# Patient Record
Sex: Male | Born: 1950 | Race: White | Hispanic: No | Marital: Married | Smoking: Never smoker
Health system: Southern US, Community
[De-identification: ages and names within clinical notes are randomized; demographics above are authoritative.]

## PROBLEM LIST (undated history)

## (undated) DIAGNOSIS — G40909 Epilepsy, unspecified, not intractable, without status epilepticus: Secondary | ICD-10-CM

## (undated) DIAGNOSIS — I951 Orthostatic hypotension: Secondary | ICD-10-CM

## (undated) DIAGNOSIS — F329 Major depressive disorder, single episode, unspecified: Secondary | ICD-10-CM

## (undated) DIAGNOSIS — R931 Abnormal findings on diagnostic imaging of heart and coronary circulation: Secondary | ICD-10-CM

## (undated) DIAGNOSIS — N400 Enlarged prostate without lower urinary tract symptoms: Secondary | ICD-10-CM

## (undated) DIAGNOSIS — R569 Unspecified convulsions: Secondary | ICD-10-CM

## (undated) DIAGNOSIS — G709 Myoneural disorder, unspecified: Secondary | ICD-10-CM

## (undated) DIAGNOSIS — F419 Anxiety disorder, unspecified: Secondary | ICD-10-CM

## (undated) DIAGNOSIS — Z9889 Other specified postprocedural states: Secondary | ICD-10-CM

## (undated) DIAGNOSIS — Z8719 Personal history of other diseases of the digestive system: Secondary | ICD-10-CM

## (undated) DIAGNOSIS — R6889 Other general symptoms and signs: Secondary | ICD-10-CM

## (undated) DIAGNOSIS — F32A Depression, unspecified: Secondary | ICD-10-CM

## (undated) DIAGNOSIS — G903 Multi-system degeneration of the autonomic nervous system: Secondary | ICD-10-CM

## (undated) DIAGNOSIS — G4739 Other sleep apnea: Secondary | ICD-10-CM

## (undated) HISTORY — DX: Epilepsy, unspecified, not intractable, without status epilepticus: G40.909

## (undated) HISTORY — DX: Benign prostatic hyperplasia without lower urinary tract symptoms: N40.0

## (undated) HISTORY — DX: Personal history of other diseases of the digestive system: Z87.19

## (undated) HISTORY — DX: Abnormal findings on diagnostic imaging of heart and coronary circulation: R93.1

## (undated) HISTORY — DX: Major depressive disorder, single episode, unspecified: F32.9

## (undated) HISTORY — DX: Depression, unspecified: F32.A

## (undated) HISTORY — DX: Orthostatic hypotension: I95.1

## (undated) HISTORY — DX: Other specified postprocedural states: Z98.890

## (undated) HISTORY — DX: Multi-system degeneration of the autonomic nervous system: G90.3

## (undated) HISTORY — DX: Other sleep apnea: G47.39

---

## 2008-01-08 HISTORY — PX: HERNIA REPAIR: SHX51

## 2008-02-09 ENCOUNTER — Encounter: Admission: RE | Admit: 2008-02-09 | Discharge: 2008-02-09 | Payer: Self-pay | Admitting: General Surgery

## 2008-02-11 ENCOUNTER — Ambulatory Visit (HOSPITAL_BASED_OUTPATIENT_CLINIC_OR_DEPARTMENT_OTHER): Admission: RE | Admit: 2008-02-11 | Discharge: 2008-02-11 | Payer: Self-pay | Admitting: General Surgery

## 2008-03-07 ENCOUNTER — Encounter: Admission: RE | Admit: 2008-03-07 | Discharge: 2008-03-07 | Payer: Self-pay | Admitting: General Surgery

## 2008-07-14 ENCOUNTER — Inpatient Hospital Stay (HOSPITAL_COMMUNITY): Admission: EM | Admit: 2008-07-14 | Discharge: 2008-07-16 | Payer: Self-pay | Admitting: Emergency Medicine

## 2008-07-15 ENCOUNTER — Encounter (INDEPENDENT_AMBULATORY_CARE_PROVIDER_SITE_OTHER): Payer: Self-pay | Admitting: Internal Medicine

## 2008-07-18 ENCOUNTER — Inpatient Hospital Stay (HOSPITAL_COMMUNITY): Admission: EM | Admit: 2008-07-18 | Discharge: 2008-07-29 | Payer: Self-pay | Admitting: Emergency Medicine

## 2008-07-19 ENCOUNTER — Encounter (INDEPENDENT_AMBULATORY_CARE_PROVIDER_SITE_OTHER): Payer: Self-pay | Admitting: Internal Medicine

## 2008-08-09 ENCOUNTER — Emergency Department (HOSPITAL_COMMUNITY): Admission: EM | Admit: 2008-08-09 | Discharge: 2008-08-09 | Payer: Self-pay | Admitting: Emergency Medicine

## 2008-10-20 ENCOUNTER — Encounter: Payer: Self-pay | Admitting: Cardiology

## 2008-10-27 ENCOUNTER — Encounter: Payer: Self-pay | Admitting: Cardiology

## 2008-11-02 ENCOUNTER — Inpatient Hospital Stay (HOSPITAL_COMMUNITY): Admission: EM | Admit: 2008-11-02 | Discharge: 2008-11-07 | Payer: Self-pay | Admitting: Emergency Medicine

## 2008-11-03 ENCOUNTER — Encounter (INDEPENDENT_AMBULATORY_CARE_PROVIDER_SITE_OTHER): Payer: Self-pay | Admitting: Internal Medicine

## 2008-11-07 ENCOUNTER — Inpatient Hospital Stay (HOSPITAL_COMMUNITY): Admission: AD | Admit: 2008-11-07 | Discharge: 2008-11-08 | Payer: Self-pay | Admitting: Psychiatry

## 2008-11-07 ENCOUNTER — Ambulatory Visit: Payer: Self-pay | Admitting: Psychiatry

## 2008-11-09 ENCOUNTER — Other Ambulatory Visit (HOSPITAL_COMMUNITY): Admission: RE | Admit: 2008-11-09 | Discharge: 2008-11-15 | Payer: Self-pay | Admitting: Psychiatry

## 2008-11-10 ENCOUNTER — Encounter: Payer: Self-pay | Admitting: Cardiology

## 2008-11-14 ENCOUNTER — Ambulatory Visit: Payer: Self-pay | Admitting: Endocrinology

## 2008-11-14 ENCOUNTER — Ambulatory Visit: Payer: Self-pay | Admitting: Cardiology

## 2008-11-14 DIAGNOSIS — R109 Unspecified abdominal pain: Secondary | ICD-10-CM | POA: Insufficient documentation

## 2008-11-14 DIAGNOSIS — R339 Retention of urine, unspecified: Secondary | ICD-10-CM

## 2008-11-14 DIAGNOSIS — I951 Orthostatic hypotension: Secondary | ICD-10-CM | POA: Insufficient documentation

## 2008-11-14 DIAGNOSIS — F329 Major depressive disorder, single episode, unspecified: Secondary | ICD-10-CM | POA: Insufficient documentation

## 2008-11-14 DIAGNOSIS — Q078 Other specified congenital malformations of nervous system: Secondary | ICD-10-CM

## 2008-11-14 DIAGNOSIS — H0520 Unspecified exophthalmos: Secondary | ICD-10-CM | POA: Insufficient documentation

## 2008-11-14 LAB — CONVERTED CEMR LAB
Bilirubin Urine: NEGATIVE
Leukocytes, UA: NEGATIVE
Nitrite: NEGATIVE
Urine Glucose: NEGATIVE mg/dL
pH: 5.5 (ref 5.0–8.0)

## 2008-11-15 ENCOUNTER — Telehealth (INDEPENDENT_AMBULATORY_CARE_PROVIDER_SITE_OTHER): Payer: Self-pay | Admitting: *Deleted

## 2008-11-15 ENCOUNTER — Telehealth: Payer: Self-pay | Admitting: Cardiology

## 2008-11-16 ENCOUNTER — Encounter: Payer: Self-pay | Admitting: Endocrinology

## 2008-11-16 ENCOUNTER — Encounter: Payer: Self-pay | Admitting: Internal Medicine

## 2008-11-16 ENCOUNTER — Encounter: Payer: Self-pay | Admitting: Cardiology

## 2008-11-17 ENCOUNTER — Telehealth: Payer: Self-pay | Admitting: Cardiology

## 2008-11-17 LAB — CONVERTED CEMR LAB
Alpha-2-Globulin: 9.6 % (ref 7.1–11.8)
Beta Globulin: 5.1 % (ref 4.7–7.2)
Gamma Globulin: 12.4 % (ref 11.1–18.8)

## 2008-11-22 ENCOUNTER — Telehealth (INDEPENDENT_AMBULATORY_CARE_PROVIDER_SITE_OTHER): Payer: Self-pay | Admitting: *Deleted

## 2008-11-23 ENCOUNTER — Ambulatory Visit (HOSPITAL_COMMUNITY): Admission: RE | Admit: 2008-11-23 | Discharge: 2008-11-23 | Payer: Self-pay | Admitting: Gastroenterology

## 2008-11-23 LAB — CONVERTED CEMR LAB
Albumin, U: DETECTED %
Free Lambda Lt Chains,Ur: 0.16 mg/dL (ref 0.08–1.01)
Total Protein, Urine-Ur/day: 76 mg/24hr (ref 10–140)
Total Protein, Urine: 7.6 mg/dL
Volume, Urine: 1000 mL

## 2008-11-24 ENCOUNTER — Telehealth: Payer: Self-pay | Admitting: Cardiology

## 2008-11-25 ENCOUNTER — Telehealth: Payer: Self-pay | Admitting: Cardiology

## 2008-11-25 ENCOUNTER — Ambulatory Visit (HOSPITAL_COMMUNITY): Admission: RE | Admit: 2008-11-25 | Discharge: 2008-11-25 | Payer: Self-pay | Admitting: Gastroenterology

## 2008-12-02 ENCOUNTER — Ambulatory Visit: Payer: Self-pay | Admitting: Cardiology

## 2008-12-02 DIAGNOSIS — R269 Unspecified abnormalities of gait and mobility: Secondary | ICD-10-CM

## 2008-12-06 ENCOUNTER — Telehealth (INDEPENDENT_AMBULATORY_CARE_PROVIDER_SITE_OTHER): Payer: Self-pay | Admitting: *Deleted

## 2008-12-14 ENCOUNTER — Encounter: Payer: Self-pay | Admitting: Cardiology

## 2008-12-30 ENCOUNTER — Telehealth: Payer: Self-pay | Admitting: Nurse Practitioner

## 2009-01-03 ENCOUNTER — Ambulatory Visit: Payer: Self-pay | Admitting: Cardiology

## 2009-01-16 ENCOUNTER — Telehealth: Payer: Self-pay | Admitting: Cardiology

## 2009-02-16 ENCOUNTER — Encounter: Payer: Self-pay | Admitting: Cardiology

## 2009-04-17 ENCOUNTER — Ambulatory Visit: Payer: Self-pay | Admitting: Cardiology

## 2009-05-24 ENCOUNTER — Encounter: Payer: Self-pay | Admitting: Cardiology

## 2009-06-22 IMAGING — CR DG CHEST SPECIAL VIEW
1 series · 1 of 1 positions shown · non-contrast
Comparison: 02/09/2008

CLINICAL DATA: Follow-up with nipple markers.

CHEST SPECIAL VIEW

[view not recorded]
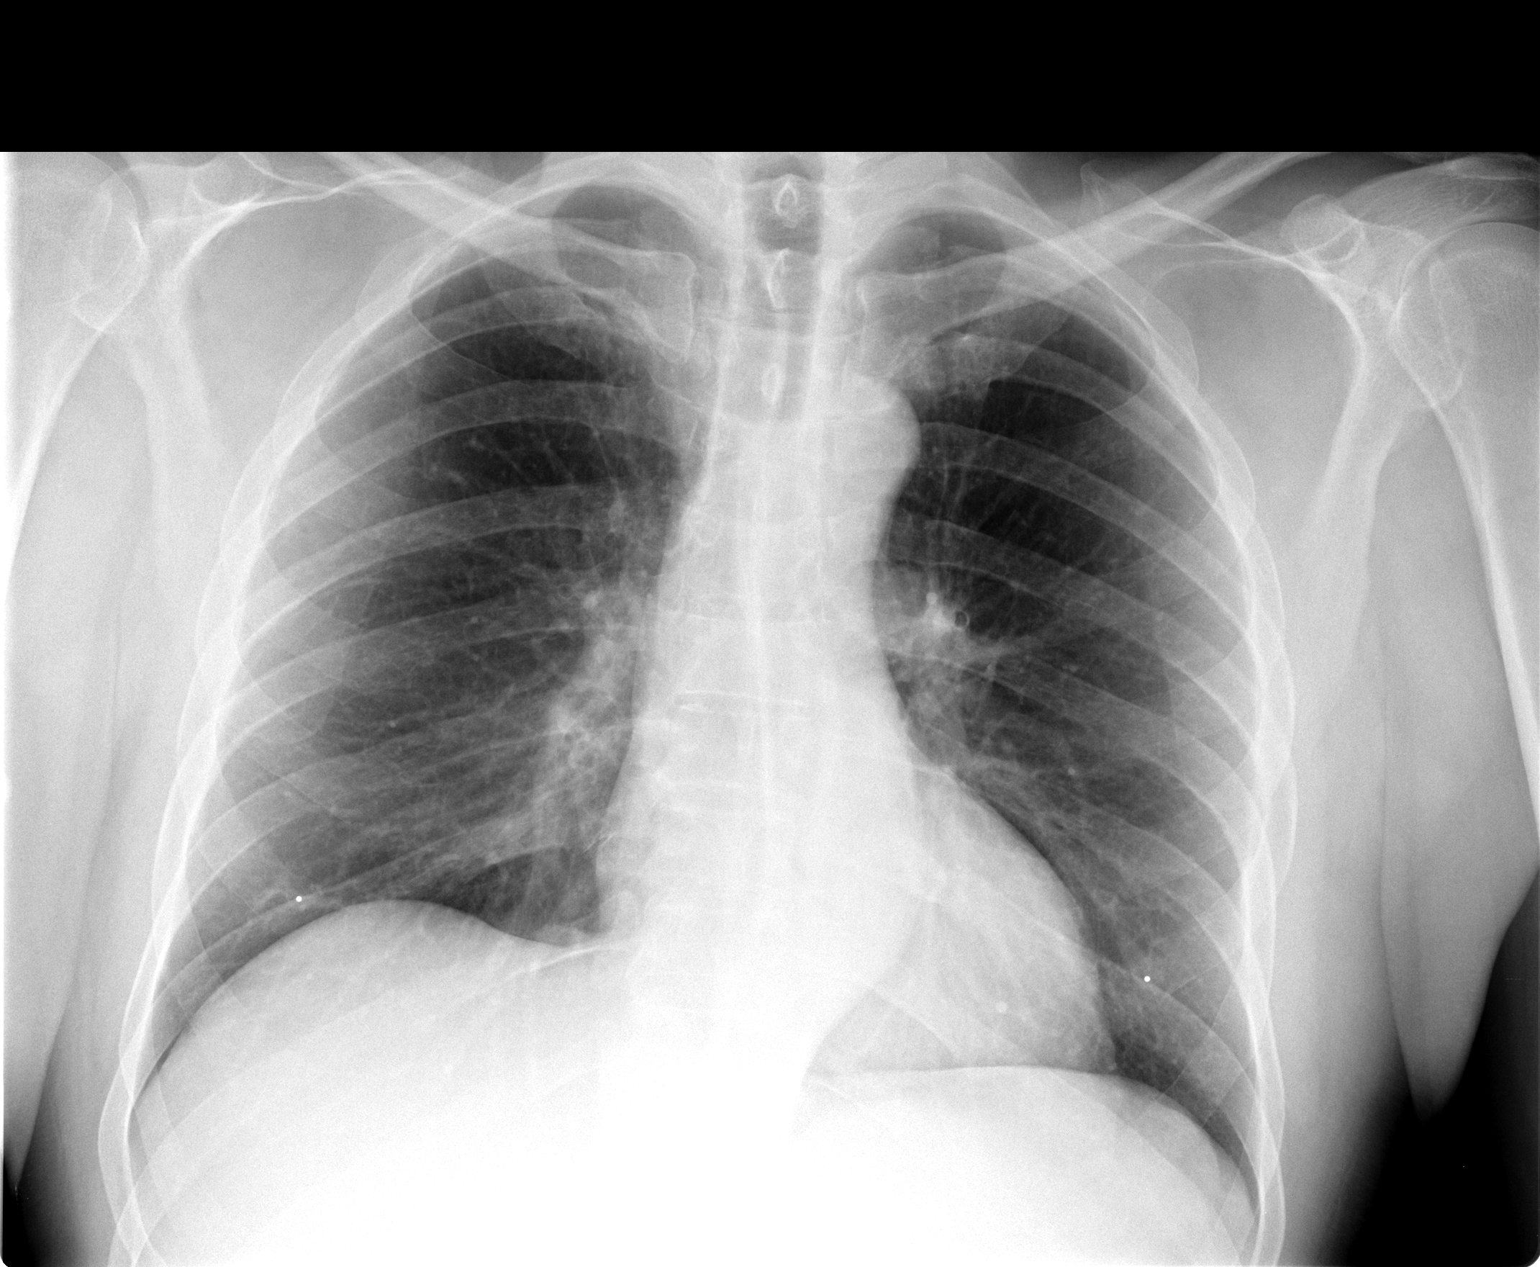

[1 of 1 positions shown; findings below may reference images not displayed]

FINDINGS: Trachea is midline.  Heart size normal.  Thoracic aorta
is tortuous.  Nipple markers overlie nipple shadows.  Lungs are
clear.  No pulmonary nodule.  No pleural fluid.
IMPRESSION: No acute findings.

## 2009-07-19 ENCOUNTER — Encounter: Payer: Self-pay | Admitting: Cardiology

## 2009-08-01 ENCOUNTER — Encounter: Admission: RE | Admit: 2009-08-01 | Discharge: 2009-08-01 | Payer: Self-pay | Admitting: Neurology

## 2010-01-29 ENCOUNTER — Encounter
Admission: RE | Admit: 2010-01-29 | Discharge: 2010-01-29 | Payer: Self-pay | Source: Home / Self Care | Attending: Gastroenterology | Admitting: Gastroenterology

## 2010-02-06 NOTE — Letter (Signed)
Summary: [facility] Office Note  [facility] Office Note   Imported By: Roderic Ovens 01/23/2009 11:34:30  _____________________________________________________________________  External Attachment:    Type:   Image     Comment:   External Document

## 2010-02-06 NOTE — Letter (Signed)
Summary: [facility] Office Visit Note   [facility] Office Visit Note   Imported By: Roderic Ovens 08/01/2009 15:46:35  _____________________________________________________________________  External Attachment:    Type:   Image     Comment:   External Document

## 2010-02-06 NOTE — Assessment & Plan Note (Signed)
Summary: f80m   Primary Provider:  Gilmore Laroche, MD  CC:  ROV.  History of Present Illness: 60 yo with complicated history including seizure disorder, depression, and severe orthostatic hypotension.  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 Sandria Manly.  He is significantly debilitated.  Labile blood pressure continues to be a problem.  He was taken off midodrine because SBP increased to above 200.  He is on fludrocortisone and pyridostigmine currently.  This combination has been doing reasonably well.  BP sitting today was 112/62 and 100/66 standing.  He has not fallen or passed out recently.  He continues to have generalized weakness and orthostatic-type dizziness that has been limiting exercise.  He is not using a walker.  He is wearing compression stockings.   Labs (11/10): creatinine 0.97, K 4.6, TSH normal, SPEP/UPEP negative for monoclonal bands, normal cosyntropin stimulation test.   Current Medications (verified): 1)  Dilantin 100 Mg Caps (Phenytoin Sodium Extended) .... Once Daily 2)  Phenobarbital 97.2 Mg Tabs (Phenobarbital) .... Once Daily 3)  Finasteride 5 Mg Tabs (Finasteride) .Marland Kitchen.. 1 By Mouth Once Daily 4)  Remeron 15 Mg Tabs (Mirtazapine) .... Once Daily 5)  Potassium Chloride Crys Cr 20 Meq Cr-Tabs (Potassium Chloride Crys Cr) .Marland Kitchen.. 1 Tab By Mouth Once Daily 6)  Vitamin D (Ergocalciferol) 50000 Unit Caps (Ergocalciferol) .... Weekly 7)  Fludrocortisone Acetate 0.1 Mg Tabs (Fludrocortisone Acetate) .... 2 Tablets Once A Day, or As Directed 8)  Pyridostigmine Bromide 60 Mg Tabs (Pyridostigmine Bromide) .Marland Kitchen.. 1  Tab By Mouth Every Morning and At 2 Pm 9)  Aspirin 81 Mg Tbec (Aspirin) .... Take One Tablet By Mouth Daily 10)  Alprazolam 0.25 Mg Tabs (Alprazolam) .... Take 1 By Mouth Once Daily 11)  Finasteride 5 Mg Tabs (Finasteride) .... Take 1 By Mouth Once Daily  Allergies: 1)  ! Pcn  Past History:  Past Medical  History: 1. Seizure disorder 2. Depression: suicide attempt by overdose on 11/03/08.  3. Inguinal hernia repair 2/10.  4. Mixed sleep apnea (central and obstructive): will be starting CPAP.  5. BPH 6. 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  Hypothalamic-pituitary-adrenal axis normal (normal cosyntropin stimulation test), TSH normal, SPEP/UPEP did not show monoclonal bands.   7. Echo (10/10): mild to moderate LV hypertrophy, EF 65%, mild to moderate aortic insufficiency.  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   Family History: Reviewed history from 11/11/2008 and no changes required. Mother is alive at age 77 and has heart disease and diabetes Father is alive at age 36 and has anemia He has 1 brother alive who has a history of Crohn's disease  Social History: Reviewed history from 11/14/2008 and no changes required. The patient lives at home with his wife in [place].  He does not drink.  He does not do drugs.  The patient has never smoked. pt is unemployed.  Review of Systems       All systems reviewed and negative except as per HPI.   Vital Signs:  Patient profile:   60 year old male Height:      69 inches Weight:      152 pounds BMI:     22.53 Pulse rate:   74 / minute BP sitting:   98 / 64  (left arm)  Vitals Entered By: Stanton Kidney, EMT-P (April 17, 2009 2:52 PM)  Serial Vital Signs/Assessments:  Time      Position  BP  Pulse  Resp  Temp     By 2:59 PM             112/62                         Stanton Kidney, EMT-P 3:00                100/66                         Stanton Kidney, EMT-P  Comments: 2:59 PM (R) arm sitting By: Stanton Kidney, EMT-P  3:00 (L) Arm Standing By: Stanton Kidney, EMT-P    Physical Exam  General:  Well developed, well nourished, in no acute distress. Neck:  Neck supple, no JVD. No masses, thyromegaly or abnormal cervical nodes. Lungs:  Clear to auscultation bilaterally. Normal respiratory effort.   Heart:  Non-displaced PMI, chest non-tender; regular rate and rhythm, S1, S2 without murmurs, rubs or gallops. Carotid upstroke normal, no bruit. Pedals normal pulses. No edema, no varicosities. Abdomen:  Bowel sounds positive; abdomen soft and non-tender without masses, organomegaly, or hernias noted. No hepatosplenomegaly. Psych:  depressed affect.     Impression & Recommendations:  Problem # 1:  ORTHOSTATIC HYPOTENSION (ICD-458.0) Patient has quite profound orthostatic hypotension that has been present since at least 6/10.  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  He is significantly debilitated.  He will continue on his current meds for orthostatic hypotension: pyridostigmine and fludrocortisone.  Continue wearing compression stockings.  He will stay off midodrine due to severe hypertension with its use.  At this point, we need to focus on helping him cope with this problem.  I would like him to do occupational therapy to help him continue to do his ADLs, which we will try to set up. I strongly encouraged him to use his walker.  Unfortunately, I do not have much else to offer Mr. Gambale at this time.  He has followup with Dr. Sandria Manly and I will see him back in 6 months.   Other Orders: Home Health Referral (Home Health)  Patient Instructions: 1)  Your physician recommends that you schedule a follow-up appointment in: 6 months 2)  Your physician recommends that you continue on your current medications as directed. Please refer to the Current Medication list given to you today. 3)  You have been referred to occupational therapy. 4)  PLEASE USE YOUR WALKER AT HOME.

## 2010-02-06 NOTE — Consult Note (Signed)
Summary: Consultation Report  Consultation Report   Imported By: [place] Carbo 07/20/2009 09:32:58  _____________________________________________________________________  External Attachment:    Type:   Image     Comment:   External Document

## 2010-02-06 NOTE — Letter (Signed)
Summary: [facility] Office Note  [facility] Office Note   Imported By: Roderic Ovens 03/15/2009 15:36:33  _____________________________________________________________________  External Attachment:    Type:   Image     Comment:   External Document

## 2010-02-06 NOTE — Progress Notes (Signed)
Summary: BP  Phone Note Call from Patient Call back at Home Phone [phone]   Caller: WIFE Call For: [facility] Summary of Call: BP WAS 223 LAST NIGHT AND IT WAS 186 THIS MORNING LAYING DOWN AND IT WAS 127/67 STANDING TODAY Initial call taken by: Harlon Flor,  January 16, 2009 9:26 AM  Follow-up for Phone Call        Dr Shirlee Latch- Pts wife is concerned about liability of BP- Dr. Sandria Manly supposedly increased pyridostigmine to 30 mg q am and 60 mg afternoon and restarted midirone.  Wife is concerned that pt could possibly have a stroke with elevated BP.  Feels like more needs to be done (medication adjustment, etc) about his BP.  She wonders if he should see a specialist at [facility].  Instructed her that I would touch base with you to get our thoughts.  Thanks! Melissa Follow-up by: Charlena Cross, RN, BSN,  January 16, 2009 11:21 AM     Appended Document: BP Would have him stop midodrine and try increasing pyridostigmine to 60 mg two times a day.  I am not sure there would be a lot additional to offer him at [facility] but they are welcome to try.   Appended Document: BP Left message to call x2. Charlena Cross RN BSN   Appended Document: BP spoke with pts wife.  will try medication changes.  Charlena Cross RN BSN   Clinical Lists Changes  Medications: Changed medication from PYRIDOSTIGMINE BROMIDE 60 MG TABS (PYRIDOSTIGMINE BROMIDE) 1/2  tab by mouth daily to PYRIDOSTIGMINE BROMIDE 60 MG TABS (PYRIDOSTIGMINE BROMIDE) 1  tab by mouth every morning and at 2 pm

## 2010-02-06 NOTE — Miscellaneous (Signed)
[facility]: [facility]   Imported By: Harlon Flor 05/29/2009 08:00:27  _____________________________________________________________________  External Attachment:    Type:   Image     Comment:   External Document

## 2010-04-11 LAB — BASIC METABOLIC PANEL
CO2: 26 mEq/L (ref 19–32)
Creatinine, Ser: 0.79 mg/dL (ref 0.4–1.5)
GFR calc non Af Amer: >60 mL/min (ref >60)
Glucose, Bld: 95 mg/dL (ref 70–99)
Potassium: 3.5 mEq/L (ref 3.5–5.1)
Sodium: 140 mEq/L (ref 135–145)

## 2010-04-12 LAB — BASIC METABOLIC PANEL
BUN: 8 mg/dL (ref 6–23)
BUN: 9 mg/dL (ref 6–23)
CO2: 28 mEq/L (ref 19–32)
CO2: 30 mEq/L (ref 19–32)
Calcium: 8.1 mg/dL — ABNORMAL LOW (ref 8.4–10.5)
Calcium: 8.2 mg/dL — ABNORMAL LOW (ref 8.4–10.5)
Calcium: 8.2 mg/dL — ABNORMAL LOW (ref 8.4–10.5)
Chloride: 106 mEq/L (ref 96–112)
Chloride: 109 mEq/L (ref 96–112)
Creatinine, Ser: 0.73 mg/dL (ref 0.4–1.5)
Creatinine, Ser: 0.8 mg/dL (ref 0.4–1.5)
Creatinine, Ser: 0.82 mg/dL (ref 0.4–1.5)
GFR calc Af Amer: >60 mL/min (ref >60)
GFR calc Af Amer: >60 mL/min (ref >60)
GFR calc Af Amer: >60 mL/min (ref >60)
GFR calc Af Amer: >60 mL/min (ref >60)
GFR calc Af Amer: >60 mL/min (ref >60)
GFR calc Af Amer: >60 mL/min (ref >60)
GFR calc non Af Amer: >60 mL/min (ref >60)
GFR calc non Af Amer: >60 mL/min (ref >60)
GFR calc non Af Amer: >60 mL/min (ref >60)
GFR calc non Af Amer: >60 mL/min (ref >60)
Potassium: 2.5 mEq/L — CL (ref 3.5–5.1)
Potassium: 2.6 mEq/L — CL (ref 3.5–5.1)
Potassium: 3.1 mEq/L — ABNORMAL LOW (ref 3.5–5.1)
Potassium: 3.4 mEq/L — ABNORMAL LOW (ref 3.5–5.1)
Sodium: 139 mEq/L (ref 135–145)
Sodium: 142 mEq/L (ref 135–145)
Sodium: 142 mEq/L (ref 135–145)
Sodium: 143 mEq/L (ref 135–145)
Sodium: 143 mEq/L (ref 135–145)

## 2010-04-12 LAB — BLOOD GAS, ARTERIAL
Acid-Base Excess: 3.7 mmol/L — ABNORMAL HIGH (ref 0.0–2.0)
Drawn by: 229971
O2 Content: 15 L/min
O2 Saturation: 99.4 %
Patient temperature: 98.6
TCO2: 23.1 mmol/L (ref 0–100)
pH, Arterial: 7.446 (ref 7.350–7.450)

## 2010-04-12 LAB — RAPID URINE DRUG SCREEN, HOSP PERFORMED
Amphetamines: NOT DETECTED
Benzodiazepines: NOT DETECTED

## 2010-04-12 LAB — CBC
Hemoglobin: 13.2 g/dL (ref 13.0–17.0)
Hemoglobin: 17.1 g/dL — ABNORMAL HIGH (ref 13.0–17.0)
MCHC: 34.4 g/dL (ref 30.0–36.0)
MCHC: 34.5 g/dL (ref 30.0–36.0)
MCV: 92.1 fL (ref 78.0–100.0)
Platelets: 149 10*3/uL — ABNORMAL LOW (ref 150–400)
RBC: 4.16 MIL/uL — ABNORMAL LOW (ref 4.22–5.81)

## 2010-04-12 LAB — COMPREHENSIVE METABOLIC PANEL
BUN: 11 mg/dL (ref 6–23)
BUN: 8 mg/dL (ref 6–23)
CO2: 26 mEq/L (ref 19–32)
CO2: 28 mEq/L (ref 19–32)
Calcium: 8.2 mg/dL — ABNORMAL LOW (ref 8.4–10.5)
Calcium: 9.1 mg/dL (ref 8.4–10.5)
Chloride: 104 mEq/L (ref 96–112)
Creatinine, Ser: 0.72 mg/dL (ref 0.4–1.5)
Creatinine, Ser: 0.78 mg/dL (ref 0.4–1.5)
GFR calc Af Amer: >60 mL/min (ref >60)
GFR calc non Af Amer: >60 mL/min (ref >60)
Glucose, Bld: 99 mg/dL (ref 70–99)
Sodium: 140 mEq/L (ref 135–145)
Total Bilirubin: 1 mg/dL (ref 0.3–1.2)

## 2010-04-12 LAB — CARDIAC PANEL(CRET KIN+CKTOT+MB+TROPI)
Relative Index: INVALID (ref 0.0–2.5)
Relative Index: INVALID (ref 0.0–2.5)
Troponin I: 0.1 ng/mL — ABNORMAL HIGH (ref 0.00–0.06)

## 2010-04-12 LAB — PHENYTOIN LEVEL, TOTAL: Phenytoin Lvl: <2.5 ug/mL — ABNORMAL LOW (ref 10.0–20.0)

## 2010-04-12 LAB — DIFFERENTIAL
Basophils Relative: 0 % (ref 0–1)
Eosinophils Absolute: 0 10*3/uL (ref 0.0–0.7)
Lymphocytes Relative: 10 % — ABNORMAL LOW (ref 12–46)

## 2010-04-12 LAB — SALICYLATE LEVEL: Salicylate Lvl: <4 mg/dL (ref 2.8–20.0)

## 2010-04-12 LAB — TROPONIN I: Troponin I: 0.03 ng/mL (ref 0.00–0.06)

## 2010-04-12 LAB — CORTISOL: Cortisol, Plasma: 4.4 ug/dL

## 2010-04-12 LAB — ACETAMINOPHEN LEVEL: Acetaminophen (Tylenol), Serum: <10 ug/mL — ABNORMAL LOW (ref 10–30)

## 2010-04-14 LAB — URINE MICROSCOPIC-ADD ON

## 2010-04-14 LAB — URINALYSIS, ROUTINE W REFLEX MICROSCOPIC
Nitrite: NEGATIVE
Specific Gravity, Urine: 1.004 — ABNORMAL LOW (ref 1.005–1.030)
Urobilinogen, UA: 0.2 mg/dL (ref 0.0–1.0)

## 2010-04-15 LAB — CK TOTAL AND CKMB (NOT AT ARMC)
CK, MB: 1 ng/mL (ref 0.3–4.0)
CK, MB: 1 ng/mL (ref 0.3–4.0)
CK, MB: 1.3 ng/mL (ref 0.3–4.0)
Relative Index: INVALID (ref 0.0–2.5)
Relative Index: INVALID (ref 0.0–2.5)
Relative Index: INVALID (ref 0.0–2.5)
Relative Index: INVALID (ref 0.0–2.5)
Relative Index: INVALID (ref 0.0–2.5)
Relative Index: INVALID (ref 0.0–2.5)
Total CK: 989 U/L — ABNORMAL HIGH (ref 7–232)

## 2010-04-15 LAB — POCT CARDIAC MARKERS
Myoglobin, poc: 115 ng/mL (ref 12–200)
Troponin i, poc: <0.05 ng/mL (ref 0.00–0.09)

## 2010-04-15 LAB — CBC
HCT: 35.8 % — ABNORMAL LOW (ref 39.0–52.0)
HCT: 36.3 % — ABNORMAL LOW (ref 39.0–52.0)
HCT: 36.6 % — ABNORMAL LOW (ref 39.0–52.0)
HCT: 37.5 % — ABNORMAL LOW (ref 39.0–52.0)
HCT: 38.5 % — ABNORMAL LOW (ref 39.0–52.0)
HCT: 43.8 % (ref 39.0–52.0)
Hemoglobin: 12.4 g/dL — ABNORMAL LOW (ref 13.0–17.0)
Hemoglobin: 12.6 g/dL — ABNORMAL LOW (ref 13.0–17.0)
Hemoglobin: 13.1 g/dL (ref 13.0–17.0)
Hemoglobin: 13.5 g/dL (ref 13.0–17.0)
Hemoglobin: 14.9 g/dL (ref 13.0–17.0)
MCHC: 34 g/dL (ref 30.0–36.0)
MCHC: 34.2 g/dL (ref 30.0–36.0)
MCHC: 34.3 g/dL (ref 30.0–36.0)
MCHC: 34.8 g/dL (ref 30.0–36.0)
MCV: 93.4 fL (ref 78.0–100.0)
MCV: 94.3 fL (ref 78.0–100.0)
MCV: 94.4 fL (ref 78.0–100.0)
MCV: 94.7 fL (ref 78.0–100.0)
Platelets: 145 10*3/uL — ABNORMAL LOW (ref 150–400)
Platelets: 151 10*3/uL (ref 150–400)
Platelets: 157 10*3/uL (ref 150–400)
Platelets: 167 10*3/uL (ref 150–400)
Platelets: 132 10*3/uL — ABNORMAL LOW (ref 150–400)
RBC: 3.92 MIL/uL — ABNORMAL LOW (ref 4.22–5.81)
RDW: 12.5 % (ref 11.5–15.5)
RDW: 12.8 % (ref 11.5–15.5)
RDW: 13 % (ref 11.5–15.5)
RDW: 12.4 % (ref 11.5–15.5)
WBC: 5.6 10*3/uL (ref 4.0–10.5)
WBC: 5.6 10*3/uL (ref 4.0–10.5)
WBC: 5.7 10*3/uL (ref 4.0–10.5)
WBC: 7.6 10*3/uL (ref 4.0–10.5)

## 2010-04-15 LAB — URINALYSIS, ROUTINE W REFLEX MICROSCOPIC
Bilirubin Urine: NEGATIVE
Hgb urine dipstick: NEGATIVE
Ketones, ur: NEGATIVE mg/dL
Ketones, ur: NEGATIVE mg/dL
Ketones, ur: NEGATIVE mg/dL
Leukocytes, UA: NEGATIVE
Nitrite: NEGATIVE
Nitrite: NEGATIVE
Protein, ur: NEGATIVE mg/dL
Protein, ur: NEGATIVE mg/dL
Protein, ur: NEGATIVE mg/dL
Urobilinogen, UA: 0.2 mg/dL (ref 0.0–1.0)
Urobilinogen, UA: 0.2 mg/dL (ref 0.0–1.0)

## 2010-04-15 LAB — BASIC METABOLIC PANEL
BUN: 11 mg/dL (ref 6–23)
BUN: 12 mg/dL (ref 6–23)
BUN: 12 mg/dL (ref 6–23)
BUN: 13 mg/dL (ref 6–23)
BUN: 13 mg/dL (ref 6–23)
BUN: 18 mg/dL (ref 6–23)
CO2: 24 mEq/L (ref 19–32)
CO2: 25 mEq/L (ref 19–32)
CO2: 26 mEq/L (ref 19–32)
CO2: 28 mEq/L (ref 19–32)
CO2: 30 mEq/L (ref 19–32)
CO2: 31 mEq/L (ref 19–32)
Calcium: 8.4 mg/dL (ref 8.4–10.5)
Calcium: 8.5 mg/dL (ref 8.4–10.5)
Calcium: 8.6 mg/dL (ref 8.4–10.5)
Chloride: 108 mEq/L (ref 96–112)
Chloride: 109 mEq/L (ref 96–112)
Chloride: 111 mEq/L (ref 96–112)
Chloride: 111 mEq/L (ref 96–112)
Chloride: 112 mEq/L (ref 96–112)
Chloride: 112 mEq/L (ref 96–112)
Creatinine, Ser: 0.83 mg/dL (ref 0.4–1.5)
Creatinine, Ser: 0.86 mg/dL (ref 0.4–1.5)
Creatinine, Ser: 0.87 mg/dL (ref 0.4–1.5)
Creatinine, Ser: 0.96 mg/dL (ref 0.4–1.5)
Creatinine, Ser: 0.98 mg/dL (ref 0.4–1.5)
Creatinine, Ser: 1.16 mg/dL (ref 0.4–1.5)
GFR calc Af Amer: >60 mL/min (ref >60)
GFR calc Af Amer: >60 mL/min (ref >60)
GFR calc Af Amer: >60 mL/min (ref >60)
GFR calc Af Amer: >60 mL/min (ref >60)
GFR calc non Af Amer: >60 mL/min (ref >60)
GFR calc non Af Amer: >60 mL/min (ref >60)
GFR calc non Af Amer: >60 mL/min (ref >60)
GFR calc non Af Amer: >60 mL/min (ref >60)
Glucose, Bld: 102 mg/dL — ABNORMAL HIGH (ref 70–99)
Glucose, Bld: 102 mg/dL — ABNORMAL HIGH (ref 70–99)
Glucose, Bld: 97 mg/dL (ref 70–99)
Glucose, Bld: 99 mg/dL (ref 70–99)
Glucose, Bld: 99 mg/dL (ref 70–99)
Glucose, Bld: 106 mg/dL — ABNORMAL HIGH (ref 70–99)
Potassium: 3.6 mEq/L (ref 3.5–5.1)
Potassium: 3.6 mEq/L (ref 3.5–5.1)
Potassium: 3.7 mEq/L (ref 3.5–5.1)
Potassium: 3.8 mEq/L (ref 3.5–5.1)
Potassium: 3.9 mEq/L (ref 3.5–5.1)
Potassium: 3.9 mEq/L (ref 3.5–5.1)
Sodium: 139 mEq/L (ref 135–145)
Sodium: 142 mEq/L (ref 135–145)
Sodium: 142 mEq/L (ref 135–145)
Sodium: 143 mEq/L (ref 135–145)
Sodium: 144 mEq/L (ref 135–145)
Sodium: 145 mEq/L (ref 135–145)

## 2010-04-15 LAB — DIFFERENTIAL
Basophils Absolute: 0 10*3/uL (ref 0.0–0.1)
Basophils Absolute: 0 10*3/uL (ref 0.0–0.1)
Basophils Relative: 0 % (ref 0–1)
Basophils Relative: 0 % (ref 0–1)
Eosinophils Relative: 1 % (ref 0–5)
Monocytes Absolute: 0.4 10*3/uL (ref 0.1–1.0)
Monocytes Absolute: 0.8 10*3/uL (ref 0.1–1.0)
Neutro Abs: 3.5 10*3/uL (ref 1.7–7.7)
Neutro Abs: 5 10*3/uL (ref 1.7–7.7)
Neutrophils Relative %: 65 % (ref 43–77)

## 2010-04-15 LAB — BARBITURATE, URINE, CONFIRMATION
Butabarbital UR Quant: NEGATIVE
Pentobarbital GC/MS Conf: NEGATIVE
Secobarbital GC/MS Conf: NEGATIVE

## 2010-04-15 LAB — COMPREHENSIVE METABOLIC PANEL
Alkaline Phosphatase: 86 U/L (ref 39–117)
BUN: 20 mg/dL (ref 6–23)
Chloride: 105 mEq/L (ref 96–112)
Glucose, Bld: 103 mg/dL — ABNORMAL HIGH (ref 70–99)
Potassium: 3 mEq/L — ABNORMAL LOW (ref 3.5–5.1)
Total Bilirubin: 0.7 mg/dL (ref 0.3–1.2)

## 2010-04-15 LAB — DRUGS OF ABUSE SCREEN W/O ALC, ROUTINE URINE
Amphetamine Screen, Ur: NEGATIVE
Cocaine Metabolites: NEGATIVE
Phencyclidine (PCP): NEGATIVE
Propoxyphene: NEGATIVE

## 2010-04-15 LAB — TROPONIN I: Troponin I: 0.04 ng/mL (ref 0.00–0.06)

## 2010-04-15 LAB — PHENYTOIN LEVEL, TOTAL: Phenytoin Lvl: 13.4 ug/mL (ref 10.0–20.0)

## 2010-04-15 LAB — CK: Total CK: 497 U/L — ABNORMAL HIGH (ref 7–232)

## 2010-04-15 LAB — APTT: aPTT: 26 seconds (ref 24–37)

## 2010-04-15 LAB — GLUCOSE, CAPILLARY
Glucose-Capillary: 101 mg/dL — ABNORMAL HIGH (ref 70–99)
Glucose-Capillary: 108 mg/dL — ABNORMAL HIGH (ref 70–99)

## 2010-04-15 LAB — CULTURE, BLOOD (ROUTINE X 2)

## 2010-04-15 LAB — RAPID URINE DRUG SCREEN, HOSP PERFORMED
Amphetamines: NOT DETECTED
Barbiturates: POSITIVE — AB

## 2010-04-15 LAB — PROTIME-INR: Prothrombin Time: 14.9 seconds (ref 11.6–15.2)

## 2010-04-15 LAB — CORTISOL: Cortisol, Plasma: 15.9 ug/dL

## 2010-04-15 LAB — MAGNESIUM: Magnesium: 1.9 mg/dL (ref 1.5–2.5)

## 2010-04-15 LAB — ETHANOL: Alcohol, Ethyl (B): <5 mg/dL (ref 0–10)

## 2010-04-24 ENCOUNTER — Emergency Department (HOSPITAL_COMMUNITY): Payer: No Typology Code available for payment source

## 2010-04-24 ENCOUNTER — Emergency Department (HOSPITAL_COMMUNITY)
Admission: EM | Admit: 2010-04-24 | Discharge: 2010-04-24 | Disposition: A | Discharge status: Home / Self Care | Payer: No Typology Code available for payment source | Attending: Emergency Medicine | Admitting: Emergency Medicine

## 2010-04-24 DIAGNOSIS — R5383 Other fatigue: Secondary | ICD-10-CM | POA: Insufficient documentation

## 2010-04-24 DIAGNOSIS — G238 Other specified degenerative diseases of basal ganglia: Secondary | ICD-10-CM | POA: Insufficient documentation

## 2010-04-24 DIAGNOSIS — K5900 Constipation, unspecified: Secondary | ICD-10-CM | POA: Insufficient documentation

## 2010-04-24 DIAGNOSIS — I10 Essential (primary) hypertension: Secondary | ICD-10-CM | POA: Insufficient documentation

## 2010-04-24 DIAGNOSIS — R5381 Other malaise: Secondary | ICD-10-CM | POA: Insufficient documentation

## 2010-04-24 LAB — DIFFERENTIAL
Basophils Absolute: 0 10*3/uL (ref 0.0–0.1)
Lymphocytes Relative: 19 % (ref 12–46)
Monocytes Absolute: 0.7 10*3/uL (ref 0.1–1.0)
Neutro Abs: 4.5 10*3/uL (ref 1.7–7.7)

## 2010-04-24 LAB — BASIC METABOLIC PANEL
Calcium: 9.3 mg/dL (ref 8.4–10.5)
GFR calc non Af Amer: >60 mL/min (ref >60)
Glucose, Bld: 96 mg/dL (ref 70–99)
Potassium: 4.6 mEq/L (ref 3.5–5.1)

## 2010-04-24 LAB — CBC
Hemoglobin: 15.3 g/dL (ref 13.0–17.0)
RDW: 12.6 % (ref 11.5–15.5)
WBC: 6.5 10*3/uL (ref 4.0–10.5)

## 2010-05-22 NOTE — Consult Note (Signed)
NAMEVIN, YONKE                 ACCOUNT NO.:  [id number]   MEDICAL RECORD NO.:  [id number]          PATIENT TYPE:  INP   LOCATION:  1431                         FACILITY[facility]  [facility]   PHYSICIAN:  Courtney Paris, M.D.DATE OF BIRTH:  September 22, 1950   DATE OF CONSULTATION:  07/25/2008  DATE OF DISCHARGE:                                 CONSULTATION   REASON FOR CONSULTATION:  Urinary retention.   BRIEF HISTORY:  This 60 year old patient was admitted several days ago  with syncope, diarrhea, dehydration and for blood pressure control.  His  blood pressure was up and down, but he was having diarrhea presumably  from Zoloft which he started a month ago.  On July 20, 2008, he had  urinary retention with over 1000 mL in his bladder when it was  catheterized.  The catheter was discontinued on July 22, 2008, but he  failed a voiding trial, but only 300 mL was in his bladder.  He was not  on Flomax due to the concern for possible hypotension.  The diarrhea has  stopped which had been presumed secondary to Zoloft.  He never had  urinary retention before, but did have a left inguinal hernia repair in  February of this year.  He was not catheterized.  This was done as an  outpatient, but he did have a hard time voiding for several days postop.  About a month later, he was treated for dysuria for presumed  prostatitis.  He was voiding okay at the end of April and early May  until present.  He is presently not ambulatory.   CURRENT MEDICATIONS:  When he was admitted, he was taking:  1. Dilantin.  2. Phenobarbital.  3. Zoloft.  4. Lisinopril.  5. Hydrochlorothiazide.   REVIEW OF SYSTEMS:  A 12-point medical review:  No cardiac or pulmonary  symptomatology.   PAST MEDICAL HISTORY:  He does have a seizure disorder and severe  depression.   ALLERGIES:  PENICILLIN.   FAMILY HISTORY/SOCIAL HISTORY:  He is married.  He does not have  children.  He lost his job in March 2010.  He does not smoke  or drink.   PHYSICAL EXAMINATION:  VITAL SIGNS:  Blood pressure is 154/83, pulse 59  and respirations 18.  GENERAL:  He is a pleasant, bearded, middle-aged white male in no acute  distress.  His wife is in the room and does answer a lot of the  questions for him.  HEENT:  Clear.  NECK:  Supple.  ABDOMEN:  Soft, benign without masses with recent healed left inguinal  surgical scar.  GU:  Penis is normal circumcised with Foley catheter  draining clear urine.  Bilaterally descended testes without masses or  tenderness.  Prostate is about 35 gm in size, not fixed or indurated,  really not very tender.  RECTAL:  Anal sphincter tone is good.  Perineal sensation intact.  EXTREMITIES:  Negative.  No edema.  Good distal pulses.   IMPRESSION:  1. Urinary retention.  2. Labile hypertension.  3. Diarrhea, dehydration, resolved.  4. Severe depression.  RECOMMENDATIONS:  We will start tamsulosin 0.4 mg a day.  There is less  than 1/10 of 1% evidence of incidence of drop in blood pressure.  I  would get physical therapy to ambulate the patient and then and only  then after ambulation would do a voiding trial on tamsulosin.  The  bladder was over stretched at over 1000 mL on July 20, 2008 and will  need time to get the tone reestablished.  The drugs that shrink the  prostate would take a considerable length of time.  I do not think with  the size of the prostate would be indicated.  I think once he gets back  to being ambulatory, I think  he will be able to void particularly if he  is on something like Flomax.  I doubt this will cause any problem with  his blood pressure which needs to be controlled of course before he gets  ambulatory.      Courtney Paris, M.D.  Electronically Signed     HMK/MEDQ  D:  07/25/2008  T:  07/26/2008  Job:  [id number]

## 2010-05-22 NOTE — Discharge Summary (Signed)
Andrew Horne, Andrew Horne                 ACCOUNT NO.:  [id number]   MEDICAL RECORD NO.:  [id number]          PATIENT TYPE:  INP   LOCATION:  1431                         FACILITY:  [facility]   PHYSICIAN:  Beckey Rutter, MD  DATE OF BIRTH:  Apr 23, 1950   DATE OF ADMISSION:  07/18/2008  DATE OF DISCHARGE:                               DISCHARGE SUMMARY   PRIMARY CARE PHYSICIAN:  Dr. Rubin Payor with [facility].   CHIEF COMPLAINT:  Weakness, lightheadedness and near syncope.   The patient was found to have orthostatic blood pressure.  For  discussion during the hospital stay up to July 26, 2008, please refer to  the previously dictated interim discharge summary as a progress note by  Dr. Angelena Sole.   HOSPITAL COURSE:  For the last 2 days, the patient's orthostatic blood  pressure was significantly abnormal.  The patient has a drop of 45 mmHg  when he changed position from lying to sitting position.  For that  reason, the Flomax was discontinued and the patient was started on  Avodart.  The patient's Florinef dose is increased to 0.3 mg.   I suspect the Florinef dosage and the midodrine medication will be  adjusted according to orthostatic blood pressure and the blood pressure  in general.  The patient is stable for discharge today.  He is symptom  free.  Also his orthostatic measurement today is showing lying blood  pressure is 137/77 with pulse of 67.  In the sitting position, his blood  pressure is 137/74 with pulse of 74 and standing is 126/71 with pulse of  74.  The patient will be discharged with home health visiting nurse.   DISCHARGE MEDICATIONS:  1. Dilantin XR 100 mg daily.  2. Phenobarbital 97.2 mg daily.  3. Sertraline/Zoloft 50 mg daily.  4. Lomotil 50 mg daily.  5. Aspirin 81 mg daily.  6. Avodart 0.5 mg p.o. daily.  7. Florinef 0.3 mg p.o. q.8 h.  8. Midodrine 10 mg p.o. t.i.d.  9. Remeron 15 mg p.o. at night.   DISCHARGE/PLAN:  The patient will  be discharged with home health for  further home blood pressure monitoring.  He was advised to follow up  with Dr. Rubin Payor with [facility] early next week.  My  suspicion is  the patient's Florinef and midodrine medications will need to be  adjusted accordingly.  Of note, the patient's  lisinopril/hydrochlorothiazide medication was discontinued.  He would  also be discharged with 10 mEq of K-Dur for a few days.  Further renal  function tests, potassium, blood level will need to be checked.      Beckey Rutter, MD  Electronically Signed     EME/MEDQ  D:  07/29/2008  T:  07/29/2008  Job:  [id number]   cc:   Rubin Payor, MD

## 2010-05-22 NOTE — Op Note (Signed)
NAME:  CONSTANT, MANDEVILLE NO.:  [id number]   MEDICAL RECORD NO.:  [id number]          PATIENT TYPE:  AMB   LOCATION:  DSC                          FACILITY:  [facility]   PHYSICIAN:  Juanetta Gosling, MDDATE OF BIRTH:  [birth year]   DATE OF PROCEDURE:  02/11/2008  DATE OF DISCHARGE:                               OPERATIVE REPORT   PREOPERATIVE DIAGNOSIS:  Left inguinal hernia.   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   PROCEDURE:  Left inguinal hernia repair with Ultrapro mesh patch.   SURGEON:  Juanetta Gosling, MD   ASSISTANT:  None.   ANESTHESIA:  General.   FINDINGS:  Indirect left inguinal hernia.   SPECIMEN:  None.   DRAINS:  None.   COMPLICATIONS:  None.   ESTIMATED BLOOD LOSS:  Minimal.   DISPOSITION:  To recovery room in a stable condition.   INDICATIONS:  Mr. Livingstone is a 60 year old male who has since August has  left groin pain that occurs with straining.  On his exam, he had a  reducible left inguinal hernia.  He and I discussed an open left  inguinal hernia repair with mesh.   PROCEDURE:  After informed consent was obtained, the patient was taken  to the operating room where he was administered 1 gram of intravenous  cefazolin.  He was placed under general anesthesia without complication.  His left groin was then prepped and draped in a standard, sterile, and  surgical fashion.  A surgical time-out was then performed.   A 5-cm left groin incision was then made and dissection was carried out  down to the level of the external abdominal oblique.  The superficial  epigastric veins were ligated with suture.  The external abdominal  oblique was entered through the external inguinal ring and the cord was  then encircled with a Penrose drain.  He had a fairly sizable cord  lipoma which was dissected free from the surrounding cord structures and  excised.  He also had a small indirect hernia sac that was placed  back  into the abdomen.  I then closed his internal ring with a 0 Vicryl  suture.  He also had a very weak floor but no obvious direct hernia.  Due to this, I elected to place an Ultrapro mesh patch which I fashioned  for his groin.  This was sutured into position near his pubic tubercle  but not to his pubic tubercle.  This was also placed into position  several points on his inguinal ligament and superiorly to his internal  oblique.  A T cut was made in the mesh and wrapped around the spermatic  cord.  These ends were then tacked together as well as to the inguinal  ligament.  The remainder of the mesh was laid beneath the external  abdominal oblique.  The mesh was in good position upon completion.  Irrigation was performed.  Hemostasis was observed.  The external  abdominal oblique was closed with a 2-0 Vicryl, Scarpa's was then closed  with a 3-0 Vicryl, the skin was  closed with a 4-0 Monocryl in a  subcuticular fashion.  Dermabond was placed over this.  10 mL of 0.25%  Marcaine were infiltrated into the wound as well as performing an  ilioinguinal nerve block.  He tolerated this well, was extubated in the  operating room and transferred to the recovery room in stable condition.      Juanetta Gosling, MD  Electronically Signed     MCW/MEDQ  D:  02/11/2008  T:  02/12/2008  Job:  [id number]   cc:   Broadus John T. Pamalee Leyden, MD

## 2010-05-22 NOTE — H&P (Signed)
NAME:  Andrew Horne, Andrew Horne NO.:  [id number]   MEDICAL RECORD NO.:  [id number]          PATIENT TYPE:  EMS   LOCATION:  MAJO                         FACILITY:  [facility]   PHYSICIAN:  Peggye Pitt, M.D. DATE OF BIRTH:  02-23-1950   DATE OF ADMISSION:  07/14/2008  DATE OF DISCHARGE:                              HISTORY & PHYSICAL   PRIMARY CARE PHYSICIAN:  Andrew John T. Pamalee Leyden, MD with  with [facility] practice.   CHIEF COMPLAINT:  Weakness and hypotension.   HISTORY OF PRESENT ILLNESS:  Andrew Horne is a very pleasant 60 year old  Caucasian man who was transferred from Dr. Caren Macadam office today  secondary to hypotension with a blood pressure of 74/50.  Upon  questioning of the patient, Andrew Horne describes that for the past 3  months he has lost about 25 pounds.  He has severe anhedonia and  anorexia and has been very depressed since he lost his job in late  March.  He denies any cough, chest pain, shortness of breath, headache,  blurry vision, abdominal pain, diarrhea, constipation or dysuria.  Dr.  Tanya Nones who has been following him for several  weeks for this issue  told me over the phone that he believes that the patient is severely  depressed to the point where he is not having sufficient oral intake.  He was given about 2-3 liters of fluid by both  Dr. Caren Macadam  office  and by EMS and upon arrival his blood pressure has normalized to 140/72.  Nonetheless we are called to admit him for further evaluation and  management.   ALLERGIES:  HE HAS STATED ALLERGIES TO PENICILLIN.   PAST MEDICAL HISTORY:  Significant for a seizure disorder as well as  depression.   HOME MEDICATIONS:  1. Zoloft 50 mg daily.  2. Phenytoin 100 mg daily.  3. Phenobarbital 97.2 mg daily.   SOCIAL HISTORY:  He is married, lives with his wife and has no children.  Denies any alcohol, tobacco or illicit drug use.   FAMILY HISTORY:  Significant for a mother who had a  myocardial  infarction in her 72s.  No history of cancer in the family.   REVIEW OF SYSTEMS:  Negative except as already mentioned in HPI.   PHYSICAL EXAMINATION:  VITAL SIGNS:  Upon admission blood pressure  140/72, heart rate 78, respirations 18, O2 saturations 100% on room air.  Temperature was 98.5.  GENERAL:  He is alert, awake, oriented x3, does not appear to be in any  distress.  He does have a flat affect.  HEENT:  Normocephalic, atraumatic.  His pupils are equal, reactive to  light and accommodation.  He has intact extraocular movements.  NECK:  Supple.  No JVD, no lymphadenopathy, no bruits, no goiter.  HEART:  Regular rate and rhythm with no murmurs, rubs or gallops.  LUNGS:  Clear to auscultation bilaterally.  ABDOMEN:  Soft, nontender, nondistended with positive bowel sounds.  EXTREMITIES:  He has no edema.  Positive pulses.  NEUROLOGIC:  His mental status is intact.  His cranial nerves II through  XII are intact.  His muscle strength is about a 4/5 bilateral and  symmetric.  It appears to be more effort dependent.  He does have some  mild resting tremors of bilateral upper extremities.  Babinski is  downgoing bilaterally.  Sensation is intact to light touch.   LABS UPON ADMISSION:  Sodium 142, potassium 3.8, chloride 108, bicarb  24, BUN 18, creatinine 1.16 with a glucose of 106.  WBCs 5.0, hemoglobin  13.5 and a platelet count of 132, INR of 1.1.  A chest x-ray showed  stable cardiomegaly with no acute abnormalities.  EKG  shows normal  sinus rhythm at a rate of 81.  Some left axis deviation but no ST or T-  wave changes.   ASSESSMENT AND PLAN:  1. Hypotension which has not resolved -  differential diagnosis at      this time for dehydration versus infectious etiology versus      possible adrenal insufficiency.  Given his rapid resolution with IV      fluids with his anorexia I would favor dehydration at this point in      time.  We will continue his IV fluids for  now.  Will panculture      him.  We will check a random cortisol level.  If  this is low, he      may need an ACTH Stim test.  Will start him on Megace.  Will also      rule out acute coronary syndrome with EKGs and cardiac enzymes.      Dr. Tanya Nones was very insistent that we get a psych consult during      the hospitalization which I agree to given the fact that I think      that his hypotension was secondary to dehydration because of his      severe depression causing anorexia and anhedonia. He did have a      colonoscopy last year which was negative per patient report.  Given      his weight loss and present workup is negative, consider doing a      pan CT to rule out some sort of malignancy.  2. Seizure disorder.  Will continue Dilantin and his phenobarbital.  3. Depression.  Will continue his Zoloft and obtain a psychiatric      consultation.  4. Prophylaxis while in the hospital; he will be on Protonix for GI      prophylaxis and on Lovenox for DVT prophylaxis.      Peggye Pitt, M.D.  Electronically Signed     EH/MEDQ  D:  07/14/2008  T:  07/14/2008  Job:  [id number]   cc:   Andrew John T. Pamalee Leyden, MD

## 2010-05-22 NOTE — Discharge Summary (Signed)
Andrew Horne, Andrew Horne                 ACCOUNT NO.:  [id number]   MEDICAL RECORD NO.:  [id number]          PATIENT TYPE:  INP   LOCATION:  5522                         FACILITY:  [facility]   PHYSICIAN:  Andrew Gardener, MD    DATE OF BIRTH:  1950/07/19   DATE OF ADMISSION:  07/14/2008  DATE OF DISCHARGE:  07/16/2008                               DISCHARGE SUMMARY   DISCHARGE DIAGNOSES:  1. Brief episode of hypotension secondary to dehydration from poor      oral intake.  2. Accelerated hypertension.  3. Seizure disorder.  The patient on phenobarbital and Dilantin with      subtherapeutic Dilantin level at the time of admission.  4. Depression.  5. Diffuse muscular pain secondary to depression.   DISCHARGE MEDICATIONS:  1. Zoloft 100 mg p.o. once daily.  2. Phenytoin 100 mg p.o. daily.  3. Phenobarbital 97.2 once a day.  4. Hydrochlorothiazide/lisinopril 25/20 mg 1 tablet once a day.   CONSULTATIONS:  Case discussed with Psychiatry, Andrew Jungling, MD,  over the phone.   PROCEDURES:  None.   DIAGNOSTIC STUDIES:  1. Chest x-ray on July 14, 2008, showed stable cardiomegaly without      acute problem.  2. Abdominal ultrasound on July 15, 2008, showed no acute findings.   FOLLOWUP:  1. Dr. Lynnea Horne in 1-2 weeks.  2. With Psychiatry.  Case was discussed with family and they have 2      psychiatrists that they will call to determine an appointment.   COURSE OF HOSPITALIZATION:  1. Brief episode of hypotension.  This is most likely secondary from      dehydration from poor oral intake.  As per family, the patient has      been having poor oral intake for the last few days.  The patient      was admitted to the hospital for rehydration.  He was started on IV      fluids.  Blood pressure corrected very quick and in the matter of      fact, he became very hypertensive.  IV fluids were discontinued.  2. Accelerated hypertension.  As mentioned above after a few hours of      IV  fluids, his blood pressure was close to 200.  The patient was      monitored for 24 hours and his systolic blood pressure remains      between 180-195.  The patient was started on      hydrochlorothiazide/lisinopril and that gave him better control of      his blood pressure.  I advised him to do an echocardiogram, but the      patient's family wanted to go home.  The patient will need close      followup with his blood pressure for further adjustment of his      medications.  3. Seizure disorder.  The patient has been on Dilantin and      phenobarbital.  His Dilantin level is less than 0.25 on this      admission.  The patient was  loaded with p.o. Dilantin and his      levels increased to 13.4.  He was put back on to his previous      Dilantin and his phenobarbital was continued.  4. Depression.  As per the family, the patient's depression has been      worsening over the last few months.  He has been tried on different      medicine.  He denied suicidal ideations.  I discussed with the case      with Dr. Electa Horne over the phone who recommended to increase his      Zoloft to 100 mg once a day.  Dr. Electa Horne was not able to see him      during this hospitalization because of shortage of staff, and he      stated that Dr. Jeanie Horne might see him after the weekend on      Monday.  I discussed this with the family and the patient who      preferred to go home because 2 days of his stay with lot of      financial constraints and problem to the family.  His Zoloft was      increased.  As per him and his wife, they has an appointment with      psychiatrist which he needs before, but they will call again to set      him for an appointment.  The patient denied any suicidal ideations,      and he is not committed at this time.  5. Diffuse abdominal pain.  He had abdominal pain that was evaluated      by ultrasound and that came to be normal.  His pain actually is a      part of generalized pain in  his body which is clearly related to      his depression.  No further workup at this point.   Total assessment time is 40 minutes.      Andrew Gardener, MD  Electronically Signed     NAE/MEDQ  D:  07/16/2008  T:  07/16/2008  Job:  [id number]

## 2010-05-22 NOTE — H&P (Signed)
[name], TALLMAN NO.:  [id number]   MEDICAL RECORD NO.:  [id number]          PATIENT TYPE:  INP   LOCATION:  0113                         FACILITY:  [facility]   PHYSICIAN:  Lonia Blood, M.D.DATE OF BIRTH:  1950/11/30   DATE OF ADMISSION:  07/18/2008  DATE OF DISCHARGE:                              HISTORY & PHYSICAL   PRIMARY CARE PHYSICIAN:  Production assistant, radio.   CHIEF COMPLAINT:  Weakness, lightheadedness/near syncope.   PRESENT ILLNESS:  Andrew Horne is a very pleasant 61 year old  gentleman who is well-known to our service.  He was discharged on July 16, 2008 at his request after he originally presented with severe  hypotension that had been noted by his primary care physician during  office visit.  At that time systolics were as low as 70.  At that time  the patient's intake of solids and liquids was extremely limited due to  severe depression.  With rehydration the patient's hypotension resolved  during his hospital stay and the patient in fact became hypertensive.  The decision was therefore made by the attending physician to initiate  lisinopril and HCTZ in a combination form for blood pressure management.  The patient was sent home on same after he requested that he be  discharged on July 10.  The patient states that he has had multiple  episodes of urination a day to the point that it is utterly  ridiculous.  He states that his p.o. intake has actually improved since  his discharge.  He has also had significant daily diarrhea with 2-3  loose stools a day of high volume which is per his report related to his  Zoloft therapy.  Due to his frequent urination and a high volume loose  watery stools the patient has developed significant lightheadedness.  He  has become progressively weaker over the last 48 hours.  Today he could  hardly stand without feeling severely lightheaded and, therefore,  presented to the ER.  Again he was found to  have a systolic blood  pressure of 75/42.  He denies headache, chills, nausea, vomiting, fever,  constipation, abdominal pain, chest pain, shortness of breath.   REVIEW OF SYSTEMS:  A comprehensive review of systems was unremarkable  with the exception of the multiple positives noted in the history of  present illness above.   PAST MEDICAL HISTORY:  1. Seizure disorder.  2. Hypertension.  3. Chronic Zoloft-associated diarrhea.  4. Severe depression/anhedonia.   OUTPATIENT MEDICATIONS:  1. Dilantin 1 mg p.o. daily  2. Phenobarbital 97.2 mg daily.  3. Zoloft 100 mg daily.  4. Lisinopril/HCTZ 20/25 p.o. daily.   ALLERGIES:  PENICILLIN.   FAMILY HISTORY:  The patient's mother died of an MI in her 12's.   SOCIAL HISTORY:  The patient is married, does not have children.  He  does not drink alcohol. He does not smoke tobacco.  He lost his job in  March 2010.   DATA REVIEW:  Point of care markers were negative. Dilantin level was  2.8.  Urinalysis was unrevealing.  Alcohol level  was negative.  Urinary  drug screen was negative. A 12-lead EKG revealed normal sinus rhythm.  Total CK was 989 but noted to be down from 1607 on July 10. His MB was  nonsignificant. Troponin was normal.  Sodium chloride and bicarb were  normal.  Potassium was low at 3. BUN is elevated at 20 with a creatinine  of 1.15. Glucose and calcium were normal.  CBC was unremarkable.  AST  was elevated at 100 with normal LFTs otherwise. Albumin was 4.0.  TSH  was normal on July 14, 2008. Random cortisol was normal on the patient's  previous admission.   PHYSICAL EXAMINATION:  VITAL SIGNS:  Temperature 98.2, blood pressure  75/42 but now 136/85, heart rate 82, respiratory rate 20, O2 sat 100% on  room air.  CBG 101.  GENERAL:  A thin-appearing gentleman in no acute respiratory distress.  HEENT: Normocephalic, atraumatic.  Pupils equal, round reactive to light  and accommodation.  Extraocular muscles are intact. OC/OP  clear.  NECK:  No JVD.  No lymphadenopathy, no thyromegaly.  LUNGS:  Clear to auscultation bilaterally without wheezes or rhonchi.  CARDIOVASCULAR: Regular rate and rhythm with a soft late systolic 2/6  murmur appreciable in all fields but worse in the tricuspid or mitral  area with no appreciable gallop or rub.  ABDOMEN: Thin, soft. Bowel sounds present.  No organomegaly, no rebound,  no ascites.  EXTREMITIES: There is no significant cyanosis, clubbing, edema of  bilateral extremities.  NEUROLOGIC:  Alert and oriented x4.  Cranial nerves II-XII intact  bilaterally. With 5/5 strength in the bilateral upper and lower  extremities. Intact sensation to touch throughout.   IMPRESSION AND PLAN:  1. Recurrent symptomatic hypotension - initially it would appear that      the patient's hypotension was due to severely decreased p.o. intake      and dehydration as an ultimate result of significant major      depression.  The patient's intake appears to have improved      significantly since his recent discharge, but it would appear that      a combination of Zoloft-associated diarrhea and HCTZ-related      diuresis have combined to cause recurrent severe dehydration.  The      patient's blood pressure has improved very quickly with volume      resuscitation.  This supports his diagnosis of simple dehydration.      Random cortisol level during the patient's last admission was found      to be normal/appropriately elevated.  For now I will hydrate the      patient.  We will hold all antihypertensives and in fact we will      tolerate a mild elevation of blood pressure for the present time      due to the patient's propensity for dehydration.  2. Chronic Zoloft-associated diarrhea - we will continue Zoloft.  It      is our hope that the patient's body will equilibrate to his Zoloft      and this side effect will resolve itself.  Psychiatry has been      asked to the patient by the ER.  We will  follow any further      recommendations that they may have in this regard.  3. Hypokalemia - this is likely secondary to GI loss as well as HCTZ.      The ER has administered replacement therapy as well as empiric  magnesium.  We will recheck a potassium level in the morning and      follow up a magnesium level.  4. Soft late systolic murmur - this is likely a flow murmur due to the      patient's dehydration.  Given the fact that this is the patient's      second presentation for near syncope, however, I do feel      echocardiogram is warranted.      This will be ordered for the morning.  5. Severe depression - as discussed above psychiatry was consulted in      the ER and is expected to evaluate the patient during his hospital      stay.      Lonia Blood, M.D.  Electronically Signed     JTM/MEDQ  D:  07/18/2008  T:  07/18/2008  Job:  [id number]   cc:   [facility] [place]  Fax: [phone]

## 2010-05-22 NOTE — Consult Note (Signed)
NAME:  CALIX, HEINBAUGH                 ACCOUNT NO.:  [id number]   MEDICAL RECORD NO.:  [id number]          PATIENT TYPE:  INP   LOCATION:  1431                         FACILITY:  [facility]   PHYSICIAN:  Antonietta Breach, M.D.  DATE OF BIRTH:  February 10, 1950   DATE OF CONSULTATION:  DATE OF DISCHARGE:  07/29/2008                                 CONSULTATION   REQUESTING PHYSICIAN:  [facility].   REASON FOR CONSULTATION:  Depression, side effects of medication.  Please evaluate and treat.   HISTORY OF PRESENT ILLNESS:  Mr. Amaury Kuzel is a 60 year old male  admitted to the [facility] on July 18, 2008 due to presyncope,  hypotension and dehydration.   Mr. Fandrich has been experiencing approximately 2 weeks of decreased  energy and difficulty concentrating.  He has been having some insomnia.  His primary care physician was concerned that he was starving himself to  death on purpose because his appetite has been poor.   He denies any thoughts of harming himself or others.  He has no  delusions or hallucinations.  His orientation and memory function are  intact.   He describes hope.  He also has interests in church and baseball   He has been having some diarrhea and his Zoloft was held as a possible  cause.   PAST PSYCHIATRIC HISTORY:  Mr. Ndiaye did develop depression and had  suicidal ideation in February.  At that time he had actually written a  note that he was going to kill himself and was having plans to shoot  himself with a gun.  He was given Zoloft 50 mg daily for antidepression.  Zoloft was increased to 100 mg daily 2 months ago.  Abilify was added to  Zoloft 1 month ago and he had some side effects of staggering.   He does not have any history of increased energy, decreased need for  sleep or elevated mood.   FAMILY PSYCHIATRIC HISTORY:  None known.   SOCIAL HISTORY:  Mr. Risse is married.  He does not have any children.  He has been laid off from his job  back in March.  He does not use  alcohol or illegal drugs.   PAST MEDICAL HISTORY:  1. Seizure disorder.  2. Hypertension.   ALLERGIES:  PENICILLIN.   MEDICATIONS:  His MAR is reviewed.  His Zoloft has been held.   LABORATORY DATA:  Alcohol negative.  Urine drug screen negative.  Creatinine 1.15, BUN 20, TSH was normal in early July.   REVIEW OF SYSTEMS:  CONSTITUTIONAL, HEAD, EYES, EARS, NOSE, THROAT,  Mouth, NEUROLOGIC, PSYCHIATRIC, CARDIOVASCULAR, RESPIRATORY,  GASTROINTESTINAL, GENITOURINARY, SKIN, MUSCULOSKELETAL, HEMATOLOGIC,  LYMPHATIC, ENDOCRINE, METABOLIC:  All unremarkable.   EXAMINATION:  VITAL SIGNS:  Temperature 98.3, pulse 65, respiratory rate  16, blood pressure 165/98, O2 saturation on room air 97%.  GENERAL APPEARANCE:  Mr. Riesgo is a middle-aged male sitting up in his  hospital bed with no abnormal involuntary movements.   MENTAL STATUS EXAM:  Mr. Gonzaga is alert.  His eye contact is good.  His  affect  is mildly constricted at baseline but with a broad and  appropriate range.  His mood is mildly depressed.  His concentration is  normal.  He is oriented to all spheres.  His memory is intact to  immediate, recent and remote.  His fund of knowledge and intelligence  are within normal limits.  His speech is slightly soft.  However, it  involves normal rate and prosody.  There is no dysarthria.  Thought  process logical, coherent, goal-directed.  No looseness of associations.  Thought content:  No thoughts of harming himself or others.  No  delusions or hallucinations.  His insight is intact.  Judgment is  intact.   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  Major depressive disorder recurrent, moderate.  Axis II:  Deferred.  Axis III:  See past medical history.  Axis IV:  General medical.  Axis V:  55.   Mr. Thammavong is not at risk to harm himself or others.  He agrees to call   emergency services immediately for any thoughts of harming himself,  thoughts of harming others or distress.   The undersigned provided ego supportive psychotherapy and education.   The indications, alternatives and adverse effects of Remeron were  discussed with the patient for antidepression as well as the beneficial  acute effects of appetite stimulation, anti-insomnia and anti-nausea.  The Remeron can reduce diarrhea.  The patient understands the above  information and wants to proceed with Remeron.   RECOMMENDATIONS:  1. Would start Remeron as a test dose at 7.5 mg p.o. q.p.m. and then      would increase as tolerated by 7.5 mg per evening to the initial      trial dose of 30 mg nightly.  2. Would ask the social worker to set up outpatient cognitive      behavioral therapy as well as outpatient psychiatric medication      management.  3. Regarding Remeron side effects, would be cautious with sedation and      excessive appetite.      Antonietta Breach, M.D.  Electronically Signed    JW/MEDQ  D:  08/02/2008  T:  08/02/2008  Job:  [id number]

## 2010-05-22 NOTE — Group Therapy Note (Signed)
NAME:  Andrew, Horne                 ACCOUNT NO.:  [id number]   MEDICAL RECORD NO.:  [id number]          PATIENT TYPE:  INP   LOCATION:  1431                         FACILITY:  [facility]   PHYSICIAN:  Peggye Pitt, M.D. DATE OF BIRTH:  June 25, 1950                                 PROGRESS NOTE   DATE OF DISCHARGE:  Still to be determined.   PRIMARY CARE PHYSICIAN:  Dr. Charolotte Eke with [facility].   DIAGNOSES UP TO DATE:  Include orthostatic hypotension, likely secondary to autonomic  dysfunction.  Urinary retention.  Depression.  Seizure disorder.   MEDICATIONS:  Will be dictated at time of discharge by the discharging physician.   HOSPITAL COURSE BY ACTIVE PROBLEM:  1. Andrew Horne is a very pleasant 60 year old Caucasian gentleman who      prior to this admission had been recently admitted to [facility] July 14, 2008 to July 18, 2008 at that time secondary to      hypotension with systolic blood pressures in the 70s noted by his      primary care physician.  Discussion with primary care physician at      that point, had conveyed that his PCP was very concerned for severe      anorexia secondary to his depression as a cause for his weight loss      and hypotension.  Nonetheless, over the course of his hospital stay      at [facility], he developed hypertension, prompting discharge on      hydrochlorothiazide and lisinopril.  The patient then proceeded to      have 2 syncopal events at home the day after discharge, prompting a      quick return, this time to [facility].  On admission he      was found to be severely orthostatic.  Blood pressure medications      were immediately discontinued.  Of note, the patient had been on      Zoloft for several months secondary to his depression and he had      had significant Zoloft-associated diarrhea.  So at that point it      was thought that he may have some dehydration contributing to his  orthostasis.  He was immediately started on aggressive IV fluid      repletion.  His cortisol level had been checked and was normal at      15.9.  However, after several days of aggressive IV fluid      repletion, he continued to have orthostasis, becoming very dizzy      upon standing or ambulation to the bathroom.  It is thought at this      point that he may have autonomic dysfunction contributing to his      orthostasis and so far has been started on Florinef 0.1 mg twice      daily as well as midodrine 10 mg 3 times a day.  I have also placed      TED hose.  He is still orthostatic, although it  is improving. Today      lying he has a systolic of 130, standing systolic of 90.  I believe      that time of discharge will be determined by either resolution of      his orthostasis or after physical therapy is able to see the      patient and decide if he is safe for discharge if he does not get      very dizzy with ambulation.  2. The patient has also had issues with urinary retention this      hospitalization.  An in-and-out cath was performed one point, at      which time he had a 1000 mL in his bladder.  Because of repeated in-      and-out caths secondary to inability to void, a Foley catheter was      placed.  He has failed multiple voiding trials, hence, I have      called Dr. Aldean Ast with urology to see the patient.  He has      decided to start the patient on Flomax (despite the fact the      patient has severe orthostatic hypotension).  He states that Flomax      has a less than 0.1% drop in blood pressure.  Will need to closely      monitor the patient's orthostatic vital signs on the newly started      Flomax.  Dr. Aldean Ast would like him to start the Flomax and then      have an ambulatory trial and after that, attempt a repeat voiding      trial.  If his retention does not resolve, he may need a repeat to      urology consultation versus discharge on a Foley bag with  followup      with urology.  3. For his severe depression, he has been seen by Dr. Jeanie Sewer this      hospitalization and started on Remeron.  4. Seizure disorder.  He has had no seizure activity this      hospitalization and is on Dilantin and phenobarbital.   VITAL SIGNS:  On day of this dictation, blood pressure 135/79, heart rate 68,  respirations 18, O2 sats 98% on room air with a temperature of 98.4.      Peggye Pitt, M.D.  Electronically Signed     EH/MEDQ  D:  07/26/2008  T:  07/26/2008  Job:  [id number]

## 2010-07-26 ENCOUNTER — Encounter: Payer: Self-pay | Admitting: Cardiology

## 2010-11-16 IMAGING — CR DG LUMBAR SPINE COMPLETE 4+V
5 series · 5 of 5 positions shown · non-contrast
Comparison: None.

CLINICAL DATA: Postural hypotension, back pain

LUMBAR SPINE - COMPLETE 4+ VIEW

[t l-spine a.p.]
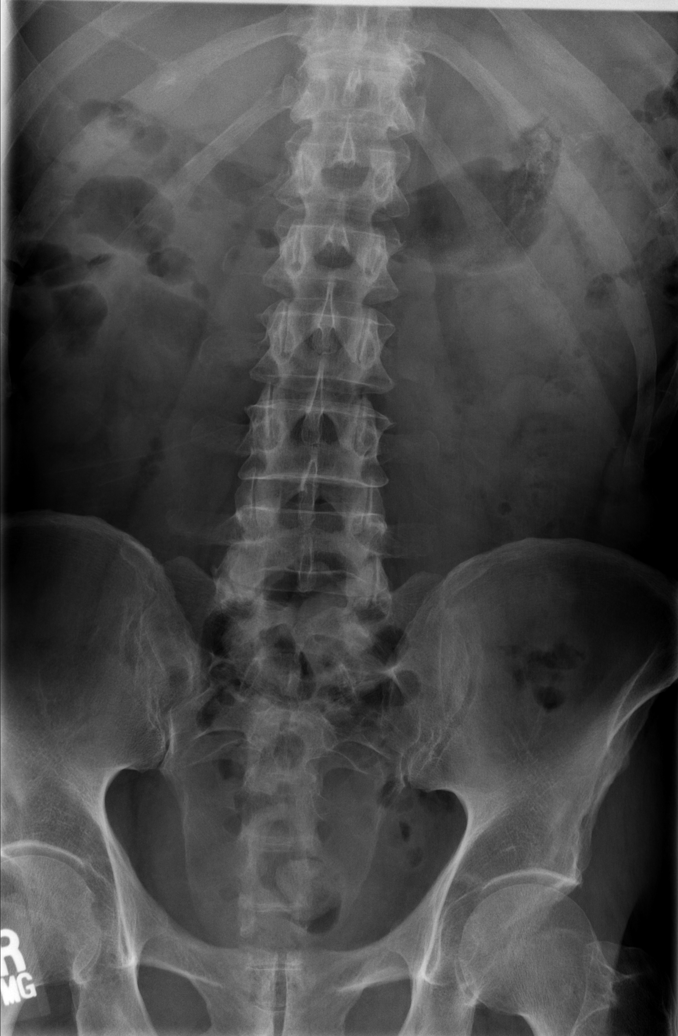

[t l-spine oblique exposure (1 of 2)]
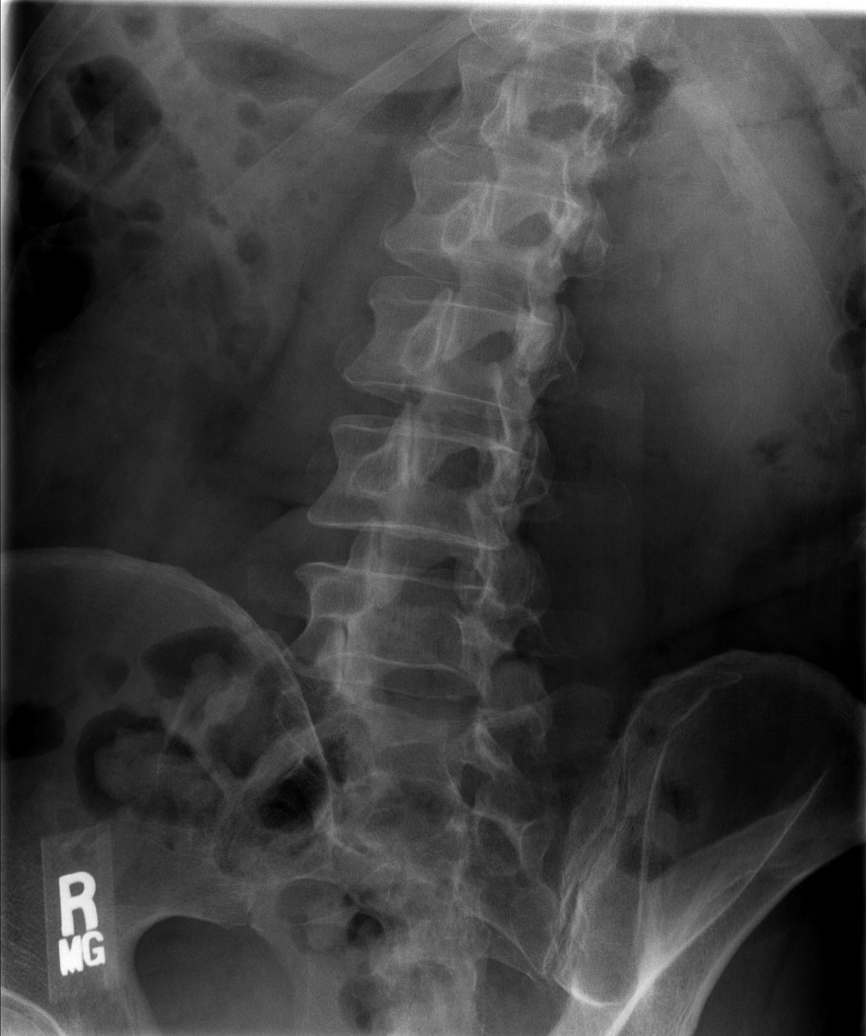

[t l-spine oblique exposure (2 of 2)]
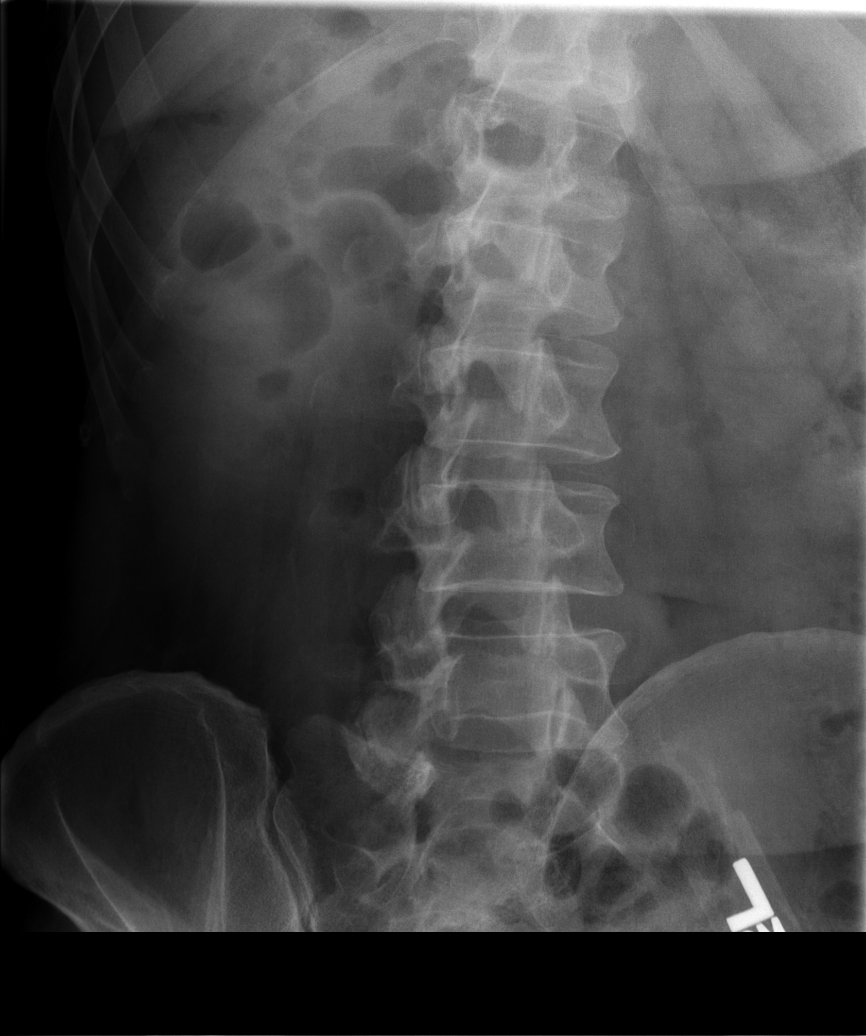

[t l-spine lat]
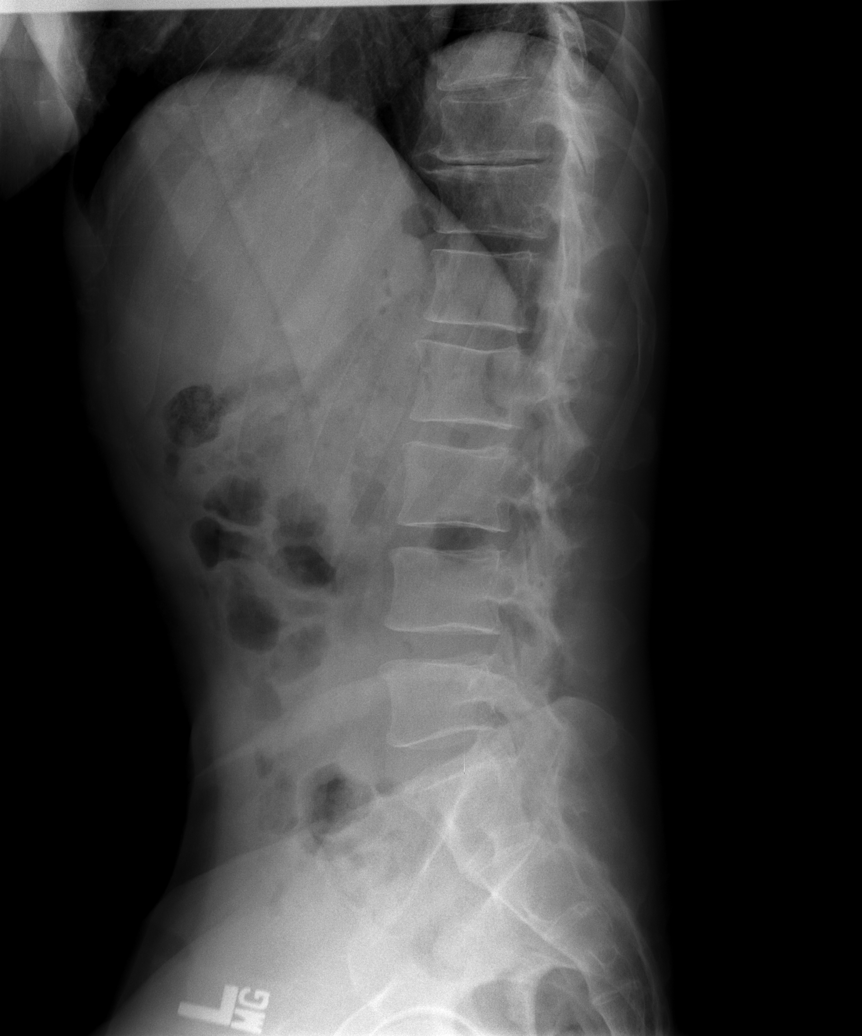

[t l-spine l5-s1 spot]
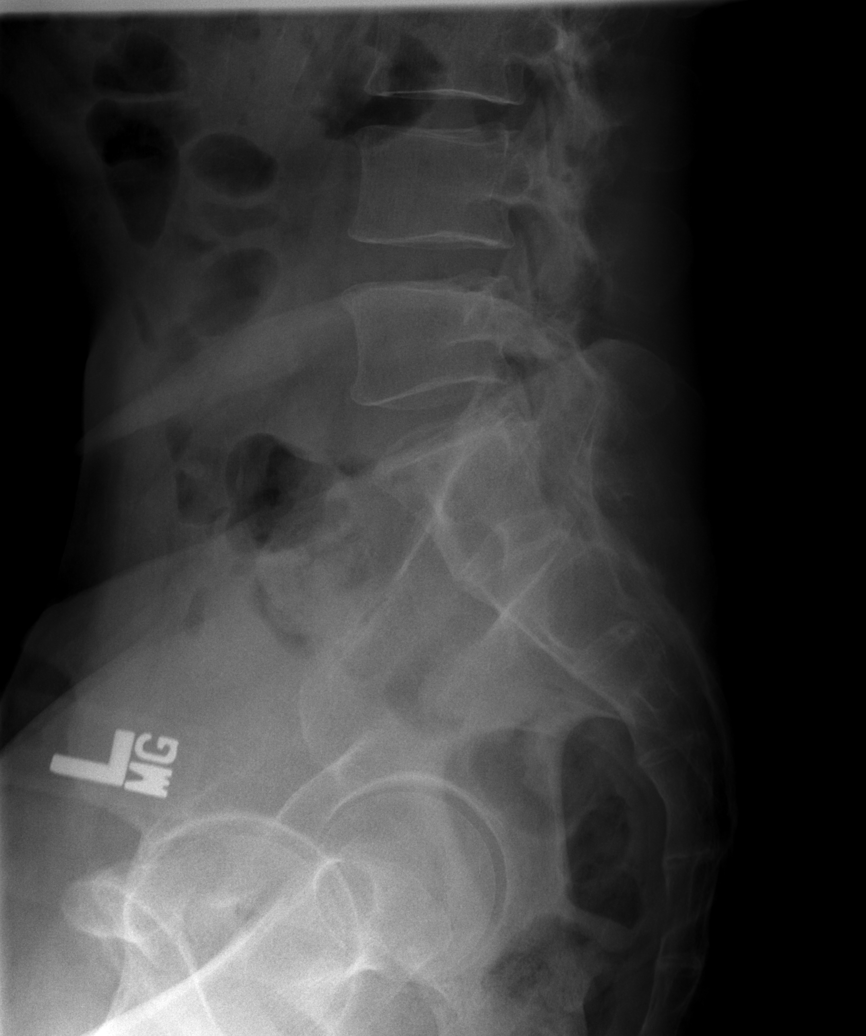

[5 of 5 positions shown; findings below may reference images not displayed]

FINDINGS: The lumbar vertebrae are in normal alignment with normal
intervertebral disc spaces.  No compression deformity is seen.
There is mild degenerative change involving the facet joint on the
right of L5-S1.  The SI joints appear normal.
IMPRESSION: Normal alignment with normal disc spaces.  Mild degenerative change
of the facet joint on the right at L5-S1.

## 2012-07-14 ENCOUNTER — Other Ambulatory Visit: Payer: Self-pay | Admitting: Urology

## 2012-07-20 ENCOUNTER — Encounter (HOSPITAL_COMMUNITY): Payer: Self-pay | Admitting: *Deleted

## 2012-07-20 ENCOUNTER — Encounter (HOSPITAL_COMMUNITY): Payer: Self-pay | Admitting: Pharmacy Technician

## 2012-07-20 NOTE — Progress Notes (Signed)
SPOKE WITH PT'S WIFE BY PHONE TO OBTAIN PT'S MEDICAL HISTORY AND REVIEWED PREOP INSTRUCTIONS AND BETASEPT BATHS / PRECAUTIONS.  PT'S WIFE TO BRING HER POA, STATES HOSPICE WILL ARRANGE HER HUSBAND'S TRANSPORTATION BY AMBULANCE AND SHE WILL RIDE WITH HIM AND BE WITH HIM.

## 2012-07-20 NOTE — Progress Notes (Signed)
DR. 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

## 2012-07-21 ENCOUNTER — Encounter (HOSPITAL_COMMUNITY): Payer: Self-pay | Admitting: *Deleted

## 2012-07-21 NOTE — H&P (Signed)
History of Present Illness  [name] presents today as a urgent work in appointment called by his hospice physician secondary to inability to remove/change of Foley catheter. [name] is Andrew Horne 62 years of age. He was originally seen by Dr. Aldean Ast in 2010 in the hospital for urinary retention. He has had a chronic indwelling Foley was last seen here in our office in March of 2012. 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  The patient has been homebound for a number of years and has been under hospice care. It appears that his Foley catheter is not been changed for least 12 months it may be more like 18+ months since the catheter was removed/changed. An attempt was made to remove his Foley and replace it today. It has continued to drain well but is not clear why that was suddenly decided today. The nurses were unable to remove the Foley despite deflating the balloon. Clinical assessment it appeared that the balloon had clearly been deflated and the catheter was in proper position. I placed lidocaine jelly around the urethra and attempted multiple times through the catheter which was unsuccessful. I suspect a large stone burden on the end of the catheter.   Past Medical History Problems  1. History of  Acute Bacterial Prostatitis 601.0 2. History of  Anxiety (Symptom) 300.00 3. History of  Arthritis V13.4 4. History of  Basal Cell Adenocarcinoma 199.1 5. History of  Constipation 564.00 6. History of  Depression 311 7. History of  Diabetes Mellitus 250.00 8. History of  Epilepsy And Recurrent Seizures 345.90 9. History of  Hypotension 458.9 10. 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 History of  Inguinal Hernia Repair 2. History of  Inguinal Hernia Repair  Current Meds 1. ALPRAZolam 0.25 MG Oral Tablet; Therapy: (Recorded:24Jan2011) to 2. Aspirin Low Dose 81 MG Oral Tablet; Therapy: (Recorded:24Jan2011) to 3.  Calcium TABS; Therapy: (Recorded:07Oct2010) to 4. Dilantin CAPS; Therapy: (Recorded:13Aug2010) to 5. Finasteride 5 MG Oral Tablet; Take 1 tablet daily; Therapy: 19Nov2010 to (Evaluate:15Nov2012);  Last Rx:21Nov2011 6. Fludrocortisone Acetate TABS; Therapy: (Recorded:01Sep2010) to 7. Lidocaine HCl 2 % External Gel; APPLY AS DIRECTED; Therapy: 11Oct2012 to (Last  Rx:11Oct2012)  Requested for: 11Oct2012 8. Midodrine HCl TABS; Therapy: (Recorded:21Mar2012) to 9. MiraLax Oral Powder; Therapy: (Recorded:25Jan2012) to 10. Mirtazapine 15 MG Oral Tablet; Therapy: (Recorded:25Jan2012) to 11. PHENobarbital 97.2 MG Oral Tablet; Therapy: (Recorded:01Sep2010) to 12. Phenytoin Sodium Extended 100 MG Oral Capsule; Therapy: (Recorded:04Nov2010) to 13. Potassium Chloride CR 20 MEQ TBCR; Therapy: (Recorded:04Nov2010) to 14. Pyridostigmine Bromide TABS; Therapy: (Recorded:24Jan2011) to 15. Vitamin D TABS; 50000 iu; Therapy: (Recorded:07Oct2010) to  Allergies Medication  1. Penicillins  Family History Problems  1. Fraternal history of  Depression 2. Paternal history of  Depression 3. Maternal history of  Diabetes Mellitus V18.0 4. Maternal history of  Family Health Status - Father's Age 15 5. Family history of  Family Health Status - Mother's Age 61 6. Fraternal history of  Family Health Status Number Of Children none 7. Maternal history of  Hypertension V17.49  Social History Problems  1. Caffeine Use rare use 2. Marital History - Currently Married 3. Occupation: unemployed since March 2010 Denied  4. History of  Alcohol Use 5. History of  Tobacco Use  Review of Systems Genitourinary, constitutional, skin, eye, otolaryngeal, hematologic/lymphatic, cardiovascular, pulmonary, endocrine, musculoskeletal, gastrointestinal, neurological and psychiatric system(s) were reviewed and pertinent findings if present are noted.  Genitourinary: foul-smelling urine, cloudy urine and penile pain.  Constitutional: feeling tired (fatigue), but no fever.  Neurological: limb weakness and difficulty walking, but no headache and no confusion.    Vitals Vital Signs [Data Includes: Last 1 Day]  08Jul2014 02:15PM  Blood Pressure: 112 / 64 Heart Rate: 69  Physical Exam Constitutional: Well nourished and well developed . No acute distress. The patient appears well hydrated.  Pulmonary: No respiratory distress and normal respiratory rhythm and effort.  Cardiovascular: Heart rate and rhythm are normal . No peripheral edema.  Abdomen: The abdomen is soft and nontender. No masses are palpated. No CVA tenderness. No hernias are palpable. No hepatosplenomegaly noted.  Genitourinary: Examination of the penis demonstrates an indwelling catheter and redness and irritation of the meatus, but no discharge, no masses, no lesions and a normal meatus. The scrotum is without lesions. The right epididymis is palpably normal and non-tender. The left epididymis is palpably normal and non-tender. The right testis is non-tender and without masses. The left testis is non-tender and without masses.    Results/Data Selected Results  CT-PELVIS W/O CONTRAST 08Jul2014 12:00AM Barron Alvine   Test Name Result Flag Reference  ** RADIOLOGY REPORT BY [facility] **   *RADIOLOGY REPORT*  Clinical Data: Evaluate for a pelvic stones. Cannot pass a Foley catheter.  CT PELVIS WITHOUT CONTRAST  Technique: Multidetector CT imaging of the pelvis was performed following the standard protocol without intravenous contrast.  Comparison: 01/29/2010  Findings: A Foley catheter extends through the urethra into the anterior aspect of the bladder. There are curved densities that project along the posterior aspect of the bladder with associated with some vague intermediate soft tissue attenuation material. The curve configuration of these suggests there could be a artificial. They could reflect curve stones. The  prostate is enlarged and indents the posterior bladder base. It is contiguous with the soft tissue attenuation that there is along the posterior aspect of the bladder. The possibility of a bladder mass should be considered. It could potentially reflect dependent debris of or products hemorrhage. There is nondependent air in the bladder. The remainder of the bladder wall is normal in thickness.  There are no pathologically enlarged lymph nodes. There is no pelvic free fluid. The visualized bowels unremarkable.  There are no osteoblastic or osteolytic lesions..  IMPRESSION: Curve densities lie dependently in the bladder associated with some soft tissue attenuation material. These may reflect curved bladder stones, but the curved morphology is atypical. The exact etiology of these densities is unclear.  A Foley catheter does pass through the urethra into the bladder. The prostate gland is enlarged.  The soft tissue attenuation material associated with the stones in the dependent bladder could potentially reflect a bladder mass. It may be debris products hemorrhage.   Original Report Authenticated By: Amie Portland, M.D.   Assessment Assessed  1. Obstructive Uropathy 599.60 2. Neurogenic Bladder 596.54 3. Bladder Calculus 594.1  Plan   Discussion/Summary  Andrew Horne has a retained Foley catheter that is not able to be removed. The balloon had deflated properly but it is likely that there is stone material on the end of the catheter which is precluding its ability to be removed. CT shows quite a bit of stone material in the bladder that I suspect may have broken off the balloon as that was deflated. It is possible that once he is asleep we made double 2 pole more aggressively on the catheter and remove it that way. It is also possible that we will be able to get  a small scope alongside the catheter and use a laser. If not we will have to go to open cystotomy. This was all discussed  with the patient and his wife.   Signatures Electronically signed by : Barron Alvine, M.D.; Jul 16 2012  9:05AM

## 2012-07-21 NOTE — Progress Notes (Signed)
HOSPICE NOTES OBTAINED AND PLACED ON CHART. MOST RECENT NEUROLOGIST OFFICE NOTES WERE 03/27/10- FROM VISIT WITH DR. Sandria Manly.  NOTES PLACE ON PT'S CHART.

## 2012-07-22 ENCOUNTER — Ambulatory Visit (HOSPITAL_COMMUNITY)
Admission: RE | Admit: 2012-07-22 | Discharge: 2012-07-22 | Disposition: A | Discharge status: Home / Self Care | Source: Ambulatory Visit | Attending: Urology | Admitting: Urology

## 2012-07-22 ENCOUNTER — Encounter (HOSPITAL_COMMUNITY): Payer: Self-pay | Admitting: *Deleted

## 2012-07-22 ENCOUNTER — Encounter (HOSPITAL_COMMUNITY): Payer: Self-pay | Admitting: Anesthesiology

## 2012-07-22 ENCOUNTER — Encounter (HOSPITAL_COMMUNITY)
Admission: RE | Disposition: A | Discharge status: Home / Self Care | Payer: Self-pay | Source: Ambulatory Visit | Attending: Urology

## 2012-07-22 ENCOUNTER — Ambulatory Visit (HOSPITAL_COMMUNITY): Admitting: Anesthesiology

## 2012-07-22 ENCOUNTER — Ambulatory Visit (HOSPITAL_COMMUNITY)

## 2012-07-22 DIAGNOSIS — N210 Calculus in bladder: Secondary | ICD-10-CM | POA: Insufficient documentation

## 2012-07-22 DIAGNOSIS — G238 Other specified degenerative diseases of basal ganglia: Secondary | ICD-10-CM | POA: Insufficient documentation

## 2012-07-22 DIAGNOSIS — G4730 Sleep apnea, unspecified: Secondary | ICD-10-CM | POA: Insufficient documentation

## 2012-07-22 DIAGNOSIS — G40909 Epilepsy, unspecified, not intractable, without status epilepticus: Secondary | ICD-10-CM | POA: Insufficient documentation

## 2012-07-22 DIAGNOSIS — Z79899 Other long term (current) drug therapy: Secondary | ICD-10-CM | POA: Insufficient documentation

## 2012-07-22 DIAGNOSIS — E119 Type 2 diabetes mellitus without complications: Secondary | ICD-10-CM | POA: Insufficient documentation

## 2012-07-22 DIAGNOSIS — N319 Neuromuscular dysfunction of bladder, unspecified: Secondary | ICD-10-CM | POA: Insufficient documentation

## 2012-07-22 DIAGNOSIS — Z7982 Long term (current) use of aspirin: Secondary | ICD-10-CM | POA: Insufficient documentation

## 2012-07-22 DIAGNOSIS — R339 Retention of urine, unspecified: Secondary | ICD-10-CM

## 2012-07-22 HISTORY — PX: INSERTION OF SUPRAPUBIC CATHETER: SHX5870

## 2012-07-22 HISTORY — PX: CYSTOSCOPY: SHX5120

## 2012-07-22 HISTORY — DX: Unspecified convulsions: R56.9

## 2012-07-22 HISTORY — DX: Anxiety disorder, unspecified: F41.9

## 2012-07-22 HISTORY — DX: Other general symptoms and signs: R68.89

## 2012-07-22 HISTORY — DX: Myoneural disorder, unspecified: G70.9

## 2012-07-22 HISTORY — PX: HOLMIUM LASER APPLICATION: SHX5852

## 2012-07-22 LAB — CBC
HCT: 38.7 % — ABNORMAL LOW (ref 39.0–52.0)
Hemoglobin: 13.4 g/dL (ref 13.0–17.0)
MCHC: 34.6 g/dL (ref 30.0–36.0)
MCV: 89 fL (ref 78.0–100.0)

## 2012-07-22 LAB — BASIC METABOLIC PANEL
BUN: 16 mg/dL (ref 6–23)
Creatinine, Ser: 0.51 mg/dL (ref 0.50–1.35)
GFR calc non Af Amer: >90 mL/min (ref >90)
Glucose, Bld: 90 mg/dL (ref 70–99)
Potassium: 4.2 mEq/L (ref 3.5–5.1)

## 2012-07-22 SURGERY — HOLMIUM LASER APPLICATION
Anesthesia: General | Wound class: Clean Contaminated

## 2012-07-22 MED ORDER — MEPERIDINE HCL 50 MG/ML IJ SOLN: 6.2500 mg | INTRAMUSCULAR | Status: DC | PRN

## 2012-07-22 MED ORDER — PHENYLEPHRINE HCL 10 MG/ML IJ SOLN
10.0000 mg | INTRAVENOUS | Status: DC | PRN
  Administered 2012-07-22: 50 ug/min via INTRAVENOUS

## 2012-07-22 MED ORDER — LACTATED RINGERS IV SOLN: INTRAVENOUS | Status: DC

## 2012-07-22 MED ORDER — KETAMINE HCL 50 MG/ML IJ SOLN
INTRAMUSCULAR | Status: DC | PRN
  Administered 2012-07-22 (×3): 50 mg via INTRAMUSCULAR
  Administered 2012-07-22: 150 mg via INTRAMUSCULAR

## 2012-07-22 MED ORDER — DOPAMINE-DEXTROSE 3.2-5 MG/ML-% IV SOLN
INTRAVENOUS | Status: AC
  Filled 2012-07-22: qty 250

## 2012-07-22 MED ORDER — 0.9 % SODIUM CHLORIDE (POUR BTL) OPTIME
TOPICAL | Status: DC | PRN
  Administered 2012-07-22: 1000 mL

## 2012-07-22 MED ORDER — HYDROCODONE-ACETAMINOPHEN 5-325 MG PO TABS: 1.0000 | ORAL_TABLET | Freq: Four times a day (QID) | ORAL | Status: AC | PRN

## 2012-07-22 MED ORDER — LIDOCAINE HCL 2 % EX GEL
CUTANEOUS | Status: DC | PRN
  Administered 2012-07-22: 1 via URETHRAL

## 2012-07-22 MED ORDER — LIDOCAINE HCL 2 % EX GEL
CUTANEOUS | Status: AC
  Filled 2012-07-22: qty 10

## 2012-07-22 MED ORDER — SULFAMETHOXAZOLE-TMP DS 800-160 MG PO TABS: 1.0000 | ORAL_TABLET | Freq: Two times a day (BID) | ORAL | Status: AC

## 2012-07-22 MED ORDER — STERILE WATER FOR IRRIGATION IR SOLN
Status: DC | PRN
  Administered 2012-07-22: 2000 mL

## 2012-07-22 MED ORDER — PROMETHAZINE HCL 25 MG/ML IJ SOLN: 6.2500 mg | INTRAMUSCULAR | Status: DC | PRN

## 2012-07-22 MED ORDER — LACTATED RINGERS IV SOLN
INTRAVENOUS | Status: DC
  Administered 2012-07-22: 1000 mL via INTRAVENOUS

## 2012-07-22 MED ORDER — FENTANYL CITRATE 0.05 MG/ML IJ SOLN: 25.0000 ug | INTRAMUSCULAR | Status: DC | PRN

## 2012-07-22 MED ORDER — STERILE WATER FOR IRRIGATION IR SOLN
Status: DC | PRN
  Administered 2012-07-22: 12000 mL

## 2012-07-22 MED ORDER — DEXTROSE 5 % IV SOLN
1.0000 g | Freq: Once | INTRAVENOUS | Status: AC
  Administered 2012-07-22: 1 g via INTRAVENOUS
  Filled 2012-07-22: qty 10

## 2012-07-22 SURGICAL SUPPLY — 42 items
ADAPTER CATH URET PLST 4-6FR (CATHETERS) IMPLANT
BAG URINE DRAINAGE (UROLOGICAL SUPPLIES) IMPLANT
BAG URINE LEG 500ML (DRAIN) ×2 IMPLANT
BAG URO CATCHER STRL LF (DRAPE) ×2 IMPLANT
BLADE SURG 15 STRL LF DISP TIS (BLADE) IMPLANT
BLADE SURG 15 STRL SS (BLADE)
CATH FOLEY 2WAY SLVR  5CC 22FR (CATHETERS) ×1
CATH FOLEY 2WAY SLVR 5CC 22FR (CATHETERS) ×1 IMPLANT
CATH URET 5FR 28IN CONE TIP (BALLOONS)
CATH URET 5FR 28IN OPEN ENDED (CATHETERS) IMPLANT
CATH URET 5FR 70CM CONE TIP (BALLOONS) IMPLANT
CATH URET WHISTLE 5FR 28IN (CATHETERS) IMPLANT
CATH URET WHISTLE 6FR (CATHETERS) IMPLANT
CLOTH BEACON ORANGE TIMEOUT ST (SAFETY) ×2 IMPLANT
COVER SURGICAL LIGHT HANDLE (MISCELLANEOUS) IMPLANT
DRAPE CAMERA CLOSED 9X96 (DRAPES) ×2 IMPLANT
ELECT REM PT RETURN 9FT ADLT (ELECTROSURGICAL)
ELECTRODE REM PT RTRN 9FT ADLT (ELECTROSURGICAL) IMPLANT
GLOVE BIOGEL M STRL SZ7.5 (GLOVE) ×2 IMPLANT
GLOVE SURG SS PI 8.0 STRL IVOR (GLOVE) IMPLANT
GOWN PREVENTION PLUS XLARGE (GOWN DISPOSABLE) IMPLANT
GOWN STRL NON-REIN LRG LVL3 (GOWN DISPOSABLE) ×2 IMPLANT
GOWN STRL REIN XL XLG (GOWN DISPOSABLE) ×4 IMPLANT
KIT SUPRAPUBIC CATH (MISCELLANEOUS) IMPLANT
LASER FIBER DISP (UROLOGICAL SUPPLIES) IMPLANT
LASER FIBER DISP 1000U (UROLOGICAL SUPPLIES) ×4 IMPLANT
MANIFOLD NEPTUNE II (INSTRUMENTS) ×2 IMPLANT
MARKER SKIN DUAL TIP RULER LAB (MISCELLANEOUS) ×2 IMPLANT
NEEDLE HYPO 22GX1.5 SAFETY (NEEDLE) IMPLANT
NEEDLE SPNL 22GX7 QUINCKE BK (NEEDLE) ×2 IMPLANT
NS IRRIG 1000ML POUR BTL (IV SOLUTION) IMPLANT
PACK CYSTO (CUSTOM PROCEDURE TRAY) ×2 IMPLANT
PENCIL BUTTON HOLSTER BLD 10FT (ELECTRODE) IMPLANT
PLUG CATH AND CAP STER (CATHETERS) IMPLANT
SUT ETHILON 2 0 PS N (SUTURE) ×2 IMPLANT
SUT ETHILON 3 0 FSL (SUTURE) IMPLANT
SYRINGE IRR TOOMEY STRL 70CC (SYRINGE) ×2 IMPLANT
TOWEL OR 17X26 10 PK STRL BLUE (TOWEL DISPOSABLE) ×2 IMPLANT
TUBING CONNECTING 10 (TUBING) ×2 IMPLANT
WATER STERILE IRR 1500ML POUR (IV SOLUTION) IMPLANT
WATER STERILE IRR 3000ML UROMA (IV SOLUTION) IMPLANT
WIRE COONS/BENSON .038X145CM (WIRE) IMPLANT

## 2012-07-22 NOTE — Progress Notes (Signed)
Spoke to [employer], Garment/textile technologist , she states ok to use estimated weight from pt's hospice nurse.  Also wife answers all questions and is POA.  EKG taken to OR and is ok (by anesthesia)

## 2012-07-22 NOTE — Progress Notes (Signed)
PATIENT HAD BOWEL MOVEMENT  IN BED- PATIENT CLEANED AND LINENS CHANGED

## 2012-07-22 NOTE — Interval H&P Note (Signed)
History and Physical Interval Note:  07/22/2012 11:09 AM  Andrew Horne  has presented today for surgery, with the diagnosis of RETAINED FOLEY CATHETER, BLADDER CALCULUS  The various methods of treatment have been discussed with the patient and family. After consideration of risks, benefits and other options for treatment, the patient has consented to  Procedure(s): CYSTOSCOPY FLEXIBLE, FLEX CYSTO WITH POSSIBLE HOLMIUM LASER LITHOTRIPSY , POSSIBLE OPEN CYSTOTOMY, SP TUBE PLACEMENT (N/A) HOLMIUM LASER APPLICATION (N/A) INSERTION OF SUPRAPUBIC CATHETER (N/A) as a surgical intervention .  The patient's history has been reviewed, patient examined, no change in status, stable for surgery.  I have reviewed the patient's chart and labs.  Questions were answered to the patient's satisfaction.     Arrie Borrelli S

## 2012-07-22 NOTE — Op Note (Signed)
Preoperative diagnosis: Neurogenic bladder, retained Foley catheter, multiple bladder calculi Postoperative diagnosis: Same  Procedure: Removal of Foley catheter under anesthesia, cystoscopy, holmium laser lithotripsy of bladder calculi, suprapubic tube placement   Surgeon: Valetta Fuller M.D.  Anesthesia: Gen.  Indications: Andrew Horne is 62 years of age. He has had Shy-Drager syndrome and is had an indwelling Foley catheter for several years. Unfortunately this catheter has not been changed for 1218 months. Recently hospice attempted to change his catheter and were unsuccessful. In our office I was unable to remove the Foley safely and on imaging it was clearly had multiple stones in the bladder as well as a stone on the end of the catheter. He presents now for definitive management of this as well as consideration of conversion to suprapubic tube. This was all discussed at length with his wife and full informed consent obtained. The patient received perioperative antibiotics.     Technique and findings: Patient was brought the operating room. Because exposure to his mouth was limited he was masked rather than having any type of endotracheal tube or LMA. He was placed in lithotomy position and prepped and draped in usual manner. With the balloon deflated on the Foley catheter with some gentle traction I was able to remove the catheter and stone material was noted to be attached to the end of the catheter. Cystoscopy revealed multiple large stones within the bladder. Most of these were clearly formed around the Foley balloon. A holmium laser lithotripter was utilized and about 40 minutes then fracturing the stones into manageable pieces which were then irrigated. We felt the patient would be better served with a suprapubic tube rather than a repeat Foley primarily for comfort reasons. With a Lowsley retractor we placed a 22 French suprapubic tube. Of note a spinal needle was used initially to confirm good  position at the dome of the bladder. The tube was confirmed to be in good position and then anchored to the skin with a nylon suture. Patient was brought to recovery room in stable condition.

## 2012-07-22 NOTE — Transfer of Care (Signed)
Immediate Anesthesia Transfer of Care Note  Patient: Andrew Horne  Procedure(s) Performed: Procedure(s): HOLMIUM LASER APPLICATION (N/A) INSERTION OF SUPRAPUBIC CATHETER (N/A) CYSTOSCOPY (N/A)  Patient Location: PACU  Anesthesia Type:General  Level of Consciousness: awake and oriented  Airway & Oxygen Therapy: Patient Spontanous Breathing and Patient connected to face mask oxygen  Post-op Assessment: Report given to PACU RN and Post -op Vital signs reviewed and stable  Post vital signs: Reviewed  Complications: No apparent anesthesia complications

## 2012-07-22 NOTE — Anesthesia Preprocedure Evaluation (Addendum)
Anesthesia Evaluation  Patient identified by MRN, date of birth, ID band Patient awake    Reviewed: Allergy & Precautions, H&P , NPO status , Patient's Chart, lab work & pertinent test results  Airway Mallampati: II TM Distance: >3 FB Neck ROM: Full    Dental no notable dental hx.    Pulmonary sleep apnea ,  breath sounds clear to auscultation  Pulmonary exam normal       Cardiovascular Exercise Tolerance: Poor negative cardio ROS  Rhythm:Regular Rate:Normal     Neuro/Psych Seizures -, Well Controlled,  PSYCHIATRIC DISORDERS Anxiety Depression Pt unable to walk or speak but mentally alert  Neuromuscular disease (Shy Drager Syndrome) negative psych ROS   GI/Hepatic negative GI ROS, Neg liver ROS,   Endo/Other  negative endocrine ROS  Renal/GU negative Renal ROS  negative genitourinary   Musculoskeletal negative musculoskeletal ROS (+)   Abdominal   Peds negative pediatric ROS (+)  Hematology negative hematology ROS (+)   Anesthesia Other Findings   Reproductive/Obstetrics negative OB ROS                         Anesthesia Physical Anesthesia Plan  ASA: III  Anesthesia Plan: General   Post-op Pain Management:    Induction: Intravenous  Airway Management Planned: Oral ETT  Additional Equipment:   Intra-op Plan:   Post-operative Plan: Extubation in OR  Informed Consent: I have reviewed the patients History and Physical, chart, labs and discussed the procedure including the risks, benefits and alternatives for the proposed anesthesia with the patient or authorized representative who has indicated his/her understanding and acceptance.   Dental advisory given  Plan Discussed with:   Anesthesia Plan Comments: (Ketamine induction. Desflurane. Use phenylephrine and dopamine to support BP.)       Anesthesia Quick Evaluation

## 2012-07-22 NOTE — Anesthesia Postprocedure Evaluation (Signed)
  Anesthesia Post-op Note  Patient: Andrew Horne  Procedure(s) Performed: Procedure(s) (LRB): HOLMIUM LASER APPLICATION (N/A) INSERTION OF SUPRAPUBIC CATHETER (N/A) CYSTOSCOPY (N/A)  Patient Location: PACU  Anesthesia Type: General  Level of Consciousness: awake and alert   Airway and Oxygen Therapy: Patient Spontanous Breathing  Post-op Pain: mild  Post-op Assessment: Post-op Vital signs reviewed, Patient's Cardiovascular Status Stable, Respiratory Function Stable, Patent Airway and No signs of Nausea or vomiting  Last Vitals:  Filed Vitals:   07/22/12 1520  BP: 123/70  Pulse: 69  Temp: 36.9 C  Resp: 16    Post-op Vital Signs: stable   Complications: No apparent anesthesia complications

## 2012-07-22 NOTE — Progress Notes (Signed)
Hospice nurse Taren RN to follow-up with patient at home in the am, pt. Waiting on EMS to transport back home.

## 2012-07-22 NOTE — Transfer of Care (Signed)
Immediate Anesthesia Transfer of Care Note  Patient: Andrew Horne  Procedure(s) Performed: Procedure(s): HOLMIUM LASER APPLICATION (N/A) INSERTION OF SUPRAPUBIC CATHETER (N/A) CYSTOSCOPY (N/A)  Patient Location: PACU  Anesthesia Type:General  Level of Consciousness: oriented  Airway & Oxygen Therapy: Patient Spontanous Breathing and Patient connected to face mask oxygen  Post-op Assessment: Report given to PACU RN and Post -op Vital signs reviewed and stable  Post vital signs: Reviewed and stable  Complications: No apparent anesthesia complications

## 2012-07-23 ENCOUNTER — Encounter (HOSPITAL_COMMUNITY): Payer: Self-pay | Admitting: Urology

## 2014-06-07 ENCOUNTER — Telehealth: Payer: Self-pay | Admitting: Neurology

## 2014-06-07 NOTE — Telephone Encounter (Signed)
[facility] [phone]) called and requested to speak with someone regarding a death certificate for the patient. He would like to know if Dr. Anne HahnWillis will sign the certificate before he sends it over. Please call and advise.

## 2014-06-07 NOTE — Telephone Encounter (Signed)
I called [facility]. I explained that the patient has not been to our office in the last few years (at least not since we have had Epic). He thanked me for calling and stated he would check with the family to find out what doctor he most recently saw.

## 2014-06-08 DEATH — deceased
